# Patient Record
Sex: Female | Born: 1955 | Race: White | Hispanic: No | Marital: Married | Smoking: Never smoker
Health system: Southern US, Community
[De-identification: ages and names within clinical notes are randomized; demographics above are authoritative.]

---

## 2014-05-21 ENCOUNTER — Inpatient Hospital Stay (HOSPITAL_COMMUNITY)
Admission: EM | Admit: 2014-05-21 | Discharge: 2014-06-12 | DRG: 286 | Disposition: E | Payer: No Typology Code available for payment source | Attending: Emergency Medicine | Admitting: Emergency Medicine

## 2014-05-21 ENCOUNTER — Emergency Department (HOSPITAL_COMMUNITY): Payer: No Typology Code available for payment source

## 2014-05-21 ENCOUNTER — Encounter (HOSPITAL_COMMUNITY): Payer: Self-pay | Admitting: Pulmonary Disease

## 2014-05-21 ENCOUNTER — Inpatient Hospital Stay (HOSPITAL_COMMUNITY): Payer: No Typology Code available for payment source

## 2014-05-21 ENCOUNTER — Encounter (HOSPITAL_COMMUNITY): Admission: EM | Disposition: E | Payer: Self-pay | Source: Home / Self Care | Attending: Emergency Medicine

## 2014-05-21 DIAGNOSIS — J9601 Acute respiratory failure with hypoxia: Secondary | ICD-10-CM | POA: Insufficient documentation

## 2014-05-21 DIAGNOSIS — R569 Unspecified convulsions: Secondary | ICD-10-CM | POA: Diagnosis not present

## 2014-05-21 DIAGNOSIS — G931 Anoxic brain damage, not elsewhere classified: Secondary | ICD-10-CM | POA: Diagnosis present

## 2014-05-21 DIAGNOSIS — I469 Cardiac arrest, cause unspecified: Secondary | ICD-10-CM | POA: Diagnosis present

## 2014-05-21 DIAGNOSIS — I5021 Acute systolic (congestive) heart failure: Secondary | ICD-10-CM | POA: Diagnosis present

## 2014-05-21 DIAGNOSIS — R570 Cardiogenic shock: Secondary | ICD-10-CM | POA: Diagnosis present

## 2014-05-21 DIAGNOSIS — N179 Acute kidney failure, unspecified: Secondary | ICD-10-CM | POA: Diagnosis present

## 2014-05-21 DIAGNOSIS — D696 Thrombocytopenia, unspecified: Secondary | ICD-10-CM | POA: Diagnosis not present

## 2014-05-21 DIAGNOSIS — E872 Acidosis: Secondary | ICD-10-CM | POA: Diagnosis not present

## 2014-05-21 DIAGNOSIS — G9340 Encephalopathy, unspecified: Secondary | ICD-10-CM | POA: Insufficient documentation

## 2014-05-21 DIAGNOSIS — Z66 Do not resuscitate: Secondary | ICD-10-CM | POA: Diagnosis not present

## 2014-05-21 DIAGNOSIS — I209 Angina pectoris, unspecified: Secondary | ICD-10-CM

## 2014-05-21 DIAGNOSIS — E871 Hypo-osmolality and hyponatremia: Secondary | ICD-10-CM | POA: Diagnosis not present

## 2014-05-21 DIAGNOSIS — G936 Cerebral edema: Secondary | ICD-10-CM | POA: Diagnosis not present

## 2014-05-21 DIAGNOSIS — R739 Hyperglycemia, unspecified: Secondary | ICD-10-CM | POA: Diagnosis not present

## 2014-05-21 DIAGNOSIS — E876 Hypokalemia: Secondary | ICD-10-CM | POA: Diagnosis present

## 2014-05-21 DIAGNOSIS — K7200 Acute and subacute hepatic failure without coma: Secondary | ICD-10-CM | POA: Diagnosis present

## 2014-05-21 DIAGNOSIS — I2510 Atherosclerotic heart disease of native coronary artery without angina pectoris: Secondary | ICD-10-CM

## 2014-05-21 DIAGNOSIS — E875 Hyperkalemia: Secondary | ICD-10-CM | POA: Diagnosis not present

## 2014-05-21 DIAGNOSIS — I447 Left bundle-branch block, unspecified: Secondary | ICD-10-CM | POA: Diagnosis not present

## 2014-05-21 DIAGNOSIS — G40901 Epilepsy, unspecified, not intractable, with status epilepticus: Secondary | ICD-10-CM | POA: Diagnosis present

## 2014-05-21 DIAGNOSIS — I4901 Ventricular fibrillation: Principal | ICD-10-CM | POA: Diagnosis present

## 2014-05-21 DIAGNOSIS — J690 Pneumonitis due to inhalation of food and vomit: Secondary | ICD-10-CM | POA: Diagnosis not present

## 2014-05-21 DIAGNOSIS — J189 Pneumonia, unspecified organism: Secondary | ICD-10-CM

## 2014-05-21 DIAGNOSIS — R079 Chest pain, unspecified: Secondary | ICD-10-CM

## 2014-05-21 HISTORY — PX: LEFT HEART CATH: SHX5478

## 2014-05-21 LAB — BASIC METABOLIC PANEL
ANION GAP: 15 (ref 5–15)
Anion gap: 11 (ref 5–15)
Anion gap: 11 (ref 5–15)
Anion gap: 15 (ref 5–15)
BUN: 8 mg/dL (ref 6–23)
BUN: 9 mg/dL (ref 6–23)
BUN: 10 mg/dL (ref 6–23)
BUN: 11 mg/dL (ref 6–23)
CALCIUM: 6.8 mg/dL — AB (ref 8.4–10.5)
CALCIUM: 7.8 mg/dL — AB (ref 8.4–10.5)
CHLORIDE: 108 meq/L (ref 96–112)
CO2: 19 mmol/L (ref 19–32)
CO2: 19 mmol/L (ref 19–32)
CO2: 16 mmol/L — ABNORMAL LOW (ref 19–32)
CO2: 15 mmol/L — ABNORMAL LOW (ref 19–32)
CREATININE: 0.88 mg/dL (ref 0.50–1.10)
Calcium: 7.7 mg/dL — ABNORMAL LOW (ref 8.4–10.5)
Calcium: 7.9 mg/dL — ABNORMAL LOW (ref 8.4–10.5)
Chloride: 108 mEq/L (ref 96–112)
Chloride: 108 mEq/L (ref 96–112)
Chloride: 107 mEq/L (ref 96–112)
Creatinine, Ser: 0.79 mg/dL (ref 0.50–1.10)
Creatinine, Ser: 0.77 mg/dL (ref 0.50–1.10)
Creatinine, Ser: 0.74 mg/dL (ref 0.50–1.10)
GFR calc Af Amer: >90 mL/min (ref >90)
GFR calc Af Amer: 83 mL/min — ABNORMAL LOW (ref >90)
GFR calc Af Amer: >90 mL/min (ref >90)
GFR calc Af Amer: >90 mL/min (ref >90)
GFR calc non Af Amer: >90 mL/min (ref >90)
GFR calc non Af Amer: >90 mL/min (ref >90)
GFR, EST NON AFRICAN AMERICAN: 72 mL/min — AB (ref >90)
GLUCOSE: 196 mg/dL — AB (ref 70–99)
Glucose, Bld: 243 mg/dL — ABNORMAL HIGH (ref 70–99)
Glucose, Bld: 135 mg/dL — ABNORMAL HIGH (ref 70–99)
Glucose, Bld: 169 mg/dL — ABNORMAL HIGH (ref 70–99)
POTASSIUM: 3.3 mmol/L — AB (ref 3.5–5.1)
POTASSIUM: 3.2 mmol/L — AB (ref 3.5–5.1)
Potassium: 2.1 mmol/L — CL (ref 3.5–5.1)
Potassium: 3.7 mmol/L (ref 3.5–5.1)
SODIUM: 138 mmol/L (ref 135–145)
SODIUM: 138 mmol/L (ref 135–145)
Sodium: 138 mmol/L (ref 135–145)
Sodium: 138 mmol/L (ref 135–145)

## 2014-05-21 LAB — RAPID URINE DRUG SCREEN, HOSP PERFORMED
Amphetamines: NOT DETECTED
BARBITURATES: NOT DETECTED
Benzodiazepines: POSITIVE — AB
COCAINE: NOT DETECTED
Opiates: NOT DETECTED
TETRAHYDROCANNABINOL: POSITIVE — AB

## 2014-05-21 LAB — GLUCOSE, CAPILLARY
GLUCOSE-CAPILLARY: 114 mg/dL — AB (ref 70–99)
Glucose-Capillary: 120 mg/dL — ABNORMAL HIGH (ref 70–99)
Glucose-Capillary: 123 mg/dL — ABNORMAL HIGH (ref 70–99)
Glucose-Capillary: 130 mg/dL — ABNORMAL HIGH (ref 70–99)
Glucose-Capillary: 154 mg/dL — ABNORMAL HIGH (ref 70–99)
Glucose-Capillary: 155 mg/dL — ABNORMAL HIGH (ref 70–99)
Glucose-Capillary: 159 mg/dL — ABNORMAL HIGH (ref 70–99)
Glucose-Capillary: 161 mg/dL — ABNORMAL HIGH (ref 70–99)
Glucose-Capillary: 179 mg/dL — ABNORMAL HIGH (ref 70–99)
Glucose-Capillary: 185 mg/dL — ABNORMAL HIGH (ref 70–99)
Glucose-Capillary: 199 mg/dL — ABNORMAL HIGH (ref 70–99)
Glucose-Capillary: 219 mg/dL — ABNORMAL HIGH (ref 70–99)
Glucose-Capillary: 222 mg/dL — ABNORMAL HIGH (ref 70–99)

## 2014-05-21 LAB — CBC WITH DIFFERENTIAL/PLATELET
Basophils Absolute: 0.1 10*3/uL (ref 0.0–0.1)
Basophils Relative: 1 % (ref 0–1)
EOS ABS: 0.2 10*3/uL (ref 0.0–0.7)
EOS PCT: 2 % (ref 0–5)
HEMATOCRIT: 36.4 % (ref 36.0–46.0)
Hemoglobin: 12.7 g/dL (ref 12.0–15.0)
LYMPHS ABS: 4.2 10*3/uL — AB (ref 0.7–4.0)
LYMPHS PCT: 44 % (ref 12–46)
MCH: 37.7 pg — AB (ref 26.0–34.0)
MCHC: 34.9 g/dL (ref 30.0–36.0)
MCV: 108 fL — ABNORMAL HIGH (ref 78.0–100.0)
MONO ABS: 0.8 10*3/uL (ref 0.1–1.0)
Monocytes Relative: 9 % (ref 3–12)
Neutro Abs: 4.3 10*3/uL (ref 1.7–7.7)
Neutrophils Relative %: 45 % (ref 43–77)
PLATELETS: 105 10*3/uL — AB (ref 150–400)
RBC: 3.37 MIL/uL — AB (ref 3.87–5.11)
RDW: 12.3 % (ref 11.5–15.5)
WBC: 9.5 10*3/uL (ref 4.0–10.5)

## 2014-05-21 LAB — URINE MICROSCOPIC-ADD ON

## 2014-05-21 LAB — URINALYSIS, ROUTINE W REFLEX MICROSCOPIC
Bilirubin Urine: NEGATIVE
Ketones, ur: NEGATIVE mg/dL
LEUKOCYTES UA: NEGATIVE
NITRITE: NEGATIVE
PH: 7.5 (ref 5.0–8.0)
Protein, ur: >300 mg/dL — AB
SPECIFIC GRAVITY, URINE: 1.013 (ref 1.005–1.030)
Urobilinogen, UA: 1 mg/dL (ref 0.0–1.0)

## 2014-05-21 LAB — PROTIME-INR
INR: 1.23 (ref 0.00–1.49)
INR: 0.98 (ref 0.00–1.49)
Prothrombin Time: 15.6 seconds — ABNORMAL HIGH (ref 11.6–15.2)
Prothrombin Time: 13.1 seconds (ref 11.6–15.2)

## 2014-05-21 LAB — COMPREHENSIVE METABOLIC PANEL
ALT: 71 U/L — AB (ref 0–35)
AST: 159 U/L — ABNORMAL HIGH (ref 0–37)
Albumin: 2.4 g/dL — ABNORMAL LOW (ref 3.5–5.2)
Alkaline Phosphatase: 93 U/L (ref 39–117)
Anion gap: 17 — ABNORMAL HIGH (ref 5–15)
BUN: 8 mg/dL (ref 6–23)
CHLORIDE: 97 meq/L (ref 96–112)
CO2: 21 mmol/L (ref 19–32)
Calcium: 8 mg/dL — ABNORMAL LOW (ref 8.4–10.5)
Creatinine, Ser: 1.28 mg/dL — ABNORMAL HIGH (ref 0.50–1.10)
GFR, EST AFRICAN AMERICAN: 53 mL/min — AB (ref >90)
GFR, EST NON AFRICAN AMERICAN: 45 mL/min — AB (ref >90)
GLUCOSE: 406 mg/dL — AB (ref 70–99)
Potassium: 2.3 mmol/L — CL (ref 3.5–5.1)
SODIUM: 135 mmol/L (ref 135–145)
TOTAL PROTEIN: 5 g/dL — AB (ref 6.0–8.3)
Total Bilirubin: 1.7 mg/dL — ABNORMAL HIGH (ref 0.3–1.2)

## 2014-05-21 LAB — POCT I-STAT, CHEM 8
BUN: 7 mg/dL (ref 6–23)
BUN: 9 mg/dL (ref 6–23)
CALCIUM ION: 1.01 mmol/L — AB (ref 1.12–1.23)
CREATININE: 0.6 mg/dL (ref 0.50–1.10)
Calcium, Ion: 1.01 mmol/L — ABNORMAL LOW (ref 1.12–1.23)
Chloride: 104 mEq/L (ref 96–112)
Chloride: 103 mEq/L (ref 96–112)
Creatinine, Ser: 0.6 mg/dL (ref 0.50–1.10)
Glucose, Bld: 188 mg/dL — ABNORMAL HIGH (ref 70–99)
Glucose, Bld: 235 mg/dL — ABNORMAL HIGH (ref 70–99)
HEMATOCRIT: 36 % (ref 36.0–46.0)
HEMATOCRIT: 41 % (ref 36.0–46.0)
HEMOGLOBIN: 12.2 g/dL (ref 12.0–15.0)
HEMOGLOBIN: 13.9 g/dL (ref 12.0–15.0)
POTASSIUM: 2 mmol/L — AB (ref 3.5–5.1)
POTASSIUM: 2.9 mmol/L — AB (ref 3.5–5.1)
Sodium: 141 mmol/L (ref 135–145)
Sodium: 139 mmol/L (ref 135–145)
TCO2: 18 mmol/L (ref 0–100)
TCO2: 16 mmol/L (ref 0–100)

## 2014-05-21 LAB — I-STAT ARTERIAL BLOOD GAS, ED
Acid-base deficit: 4 mmol/L — ABNORMAL HIGH (ref 0.0–2.0)
Bicarbonate: 23.1 mEq/L (ref 20.0–24.0)
O2 SAT: 100 %
PO2 ART: 505 mmHg — AB (ref 80.0–100.0)
TCO2: 25 mmol/L (ref 0–100)
pCO2 arterial: 49.8 mmHg — ABNORMAL HIGH (ref 35.0–45.0)
pH, Arterial: 7.274 — ABNORMAL LOW (ref 7.350–7.450)

## 2014-05-21 LAB — ETHANOL

## 2014-05-21 LAB — TROPONIN I
TROPONIN I: 0.09 ng/mL — AB (ref <0.031)
Troponin I: 5.82 ng/mL (ref <0.031)
Troponin I: 5.81 ng/mL (ref <0.031)
Troponin I: 5.26 ng/mL (ref <0.031)

## 2014-05-21 LAB — APTT
APTT: 34 s (ref 24–37)
aPTT: 45 seconds — ABNORMAL HIGH (ref 24–37)
aPTT: 28 seconds (ref 24–37)
aPTT: 28 seconds (ref 24–37)

## 2014-05-21 LAB — I-STAT CG4 LACTIC ACID, ED: LACTIC ACID, VENOUS: 9.93 mmol/L — AB (ref 0.5–2.2)

## 2014-05-21 LAB — MRSA PCR SCREENING: MRSA BY PCR: NEGATIVE

## 2014-05-21 SURGERY — LEFT HEART CATH
Anesthesia: LOCAL

## 2014-05-21 MED ORDER — HEPARIN (PORCINE) IN NACL 100-0.45 UNIT/ML-% IJ SOLN
600.0000 [IU]/h | INTRAMUSCULAR | Status: DC
  Administered 2014-05-21: 600 [IU]/h via INTRAVENOUS
  Filled 2014-05-21: qty 250

## 2014-05-21 MED ORDER — SODIUM CHLORIDE 0.9 % IV SOLN: INTRAVENOUS | Status: DC

## 2014-05-21 MED ORDER — CISATRACURIUM BOLUS VIA INFUSION
0.1000 mg/kg | Freq: Once | INTRAVENOUS | Status: DC
  Filled 2014-05-21: qty 7

## 2014-05-21 MED ORDER — NOREPINEPHRINE BITARTRATE 1 MG/ML IV SOLN
0.0000 ug/min | INTRAVENOUS | Status: DC
  Administered 2014-05-21: 5 ug/min via INTRAVENOUS
  Administered 2014-05-22: 17 ug/min via INTRAVENOUS
  Filled 2014-05-21 (×2): qty 4

## 2014-05-21 MED ORDER — MIDAZOLAM HCL 2 MG/2ML IJ SOLN: 2.0000 mg | Freq: Once | INTRAMUSCULAR | Status: DC

## 2014-05-21 MED ORDER — LIDOCAINE HCL (PF) 1 % IJ SOLN
INTRAMUSCULAR | Status: AC
  Filled 2014-05-21: qty 30

## 2014-05-21 MED ORDER — SODIUM CHLORIDE 0.9 % IV SOLN
10.0000 ug/h | INTRAVENOUS | Status: DC
  Administered 2014-05-21 (×2): 10 ug/h via INTRAVENOUS
  Filled 2014-05-21: qty 50

## 2014-05-21 MED ORDER — PROPOFOL 10 MG/ML IV EMUL
5.0000 ug/kg/min | INTRAVENOUS | Status: DC
  Administered 2014-05-21: 10 ug/kg/min via INTRAVENOUS

## 2014-05-21 MED ORDER — POTASSIUM CHLORIDE 20 MEQ/15ML (10%) PO SOLN
20.0000 meq | ORAL | Status: AC
  Administered 2014-05-21 (×3): 20 meq via ORAL
  Filled 2014-05-21 (×3): qty 15

## 2014-05-21 MED ORDER — POTASSIUM CHLORIDE 10 MEQ/50ML IV SOLN
10.0000 meq | INTRAVENOUS | Status: AC
  Administered 2014-05-21 – 2014-05-22 (×2): 10 meq via INTRAVENOUS
  Filled 2014-05-21: qty 50

## 2014-05-21 MED ORDER — FENTANYL BOLUS VIA INFUSION
50.0000 ug | INTRAVENOUS | Status: DC | PRN
  Filled 2014-05-21: qty 50

## 2014-05-21 MED ORDER — ARTIFICIAL TEARS OP OINT: TOPICAL_OINTMENT | OPHTHALMIC | Status: DC | PRN

## 2014-05-21 MED ORDER — ONDANSETRON HCL 4 MG/2ML IJ SOLN: 4.0000 mg | Freq: Four times a day (QID) | INTRAMUSCULAR | Status: DC | PRN

## 2014-05-21 MED ORDER — MIDAZOLAM BOLUS VIA INFUSION
2.0000 mg | INTRAVENOUS | Status: DC | PRN
  Filled 2014-05-21: qty 2

## 2014-05-21 MED ORDER — SODIUM CHLORIDE 0.9 % IV SOLN
INTRAVENOUS | Status: DC
  Administered 2014-05-21: 1.3 [IU]/h via INTRAVENOUS
  Filled 2014-05-21: qty 2.5

## 2014-05-21 MED ORDER — EPINEPHRINE HCL 1 MG/ML IJ SOLN
0.5000 ug/min | INTRAMUSCULAR | Status: DC
  Administered 2014-05-21: 0.5 ug/min via INTRAVENOUS
  Filled 2014-05-21: qty 4

## 2014-05-21 MED ORDER — SODIUM CHLORIDE 0.9 % IV SOLN
1.0000 ug/kg/min | INTRAVENOUS | Status: DC
  Administered 2014-05-22: 1.8 ug/kg/min via INTRAVENOUS
  Filled 2014-05-21 (×2): qty 20

## 2014-05-21 MED ORDER — FENTANYL CITRATE 0.05 MG/ML IJ SOLN: 100.0000 ug | Freq: Once | INTRAMUSCULAR | Status: DC

## 2014-05-21 MED ORDER — CHLORHEXIDINE GLUCONATE 0.12 % MT SOLN
15.0000 mL | Freq: Two times a day (BID) | OROMUCOSAL | Status: DC
  Administered 2014-05-21 – 2014-05-23 (×5): 15 mL via OROMUCOSAL
  Filled 2014-05-21 (×4): qty 15

## 2014-05-21 MED ORDER — ARTIFICIAL TEARS OP OINT
1.0000 "application " | TOPICAL_OINTMENT | Freq: Three times a day (TID) | OPHTHALMIC | Status: DC
  Administered 2014-05-21 – 2014-05-23 (×6): 1 via OPHTHALMIC
  Filled 2014-05-21: qty 3.5

## 2014-05-21 MED ORDER — NITROGLYCERIN 1 MG/10 ML FOR IR/CATH LAB
INTRA_ARTERIAL | Status: AC
  Filled 2014-05-21: qty 10

## 2014-05-21 MED ORDER — SODIUM CHLORIDE 0.9 % IV SOLN
INTRAVENOUS | Status: DC
  Administered 2014-05-21 – 2014-05-25 (×2): via INTRAVENOUS

## 2014-05-21 MED ORDER — SODIUM CHLORIDE 0.9 % IV SOLN: 2000.0000 mL | Freq: Once | INTRAVENOUS | Status: DC

## 2014-05-21 MED ORDER — HEPARIN (PORCINE) IN NACL 2-0.9 UNIT/ML-% IJ SOLN
INTRAMUSCULAR | Status: AC
  Filled 2014-05-21: qty 1500

## 2014-05-21 MED ORDER — POTASSIUM CHLORIDE 10 MEQ/50ML IV SOLN
10.0000 meq | INTRAVENOUS | Status: AC
  Administered 2014-05-21 (×6): 10 meq via INTRAVENOUS
  Filled 2014-05-21 (×6): qty 50

## 2014-05-21 MED ORDER — CETYLPYRIDINIUM CHLORIDE 0.05 % MT LIQD
7.0000 mL | Freq: Four times a day (QID) | OROMUCOSAL | Status: DC
  Administered 2014-05-21 – 2014-05-23 (×7): 7 mL via OROMUCOSAL

## 2014-05-21 MED ORDER — FAMOTIDINE IN NACL 20-0.9 MG/50ML-% IV SOLN
20.0000 mg | Freq: Two times a day (BID) | INTRAVENOUS | Status: DC
  Administered 2014-05-21 (×2): 20 mg via INTRAVENOUS
  Filled 2014-05-21 (×4): qty 50

## 2014-05-21 MED ORDER — ACETAMINOPHEN 325 MG PO TABS: 650.0000 mg | ORAL_TABLET | ORAL | Status: DC | PRN

## 2014-05-21 MED ORDER — ASPIRIN 300 MG RE SUPP
300.0000 mg | RECTAL | Status: AC
  Administered 2014-05-21: 300 mg via RECTAL
  Filled 2014-05-21: qty 1

## 2014-05-21 MED ORDER — PANTOPRAZOLE SODIUM 40 MG IV SOLR
40.0000 mg | INTRAVENOUS | Status: DC
  Administered 2014-05-22: 40 mg via INTRAVENOUS
  Filled 2014-05-21 (×3): qty 40

## 2014-05-21 MED ORDER — INSULIN ASPART 100 UNIT/ML ~~LOC~~ SOLN
2.0000 [IU] | SUBCUTANEOUS | Status: DC
  Administered 2014-05-21: 4 [IU] via SUBCUTANEOUS

## 2014-05-21 MED ORDER — HEPARIN BOLUS VIA INFUSION
2500.0000 [IU] | Freq: Once | INTRAVENOUS | Status: AC
  Administered 2014-05-21: 2500 [IU] via INTRAVENOUS
  Filled 2014-05-21: qty 2500

## 2014-05-21 MED ORDER — SODIUM CHLORIDE 0.9 % IV SOLN
1.0000 ug/kg/min | INTRAVENOUS | Status: DC
  Administered 2014-05-21: 1 ug/kg/min via INTRAVENOUS
  Filled 2014-05-21: qty 20

## 2014-05-21 MED ORDER — SODIUM CHLORIDE 0.9 % IV SOLN
1.0000 mg/h | INTRAVENOUS | Status: DC
  Administered 2014-05-21: 1 mg/h via INTRAVENOUS
  Administered 2014-05-21: 2 mg/h via INTRAVENOUS
  Administered 2014-05-21 – 2014-05-22 (×3): 4 mg/h via INTRAVENOUS
  Administered 2014-05-22: 10 mg/h via INTRAVENOUS
  Filled 2014-05-21 (×3): qty 10

## 2014-05-21 MED ORDER — SODIUM CHLORIDE 0.9 % IV SOLN
INTRAVENOUS | Status: DC
  Administered 2014-05-21: 10:00:00 via INTRAVENOUS

## 2014-05-21 MED ORDER — SODIUM CHLORIDE 0.9 % IV SOLN
25.0000 ug/h | INTRAVENOUS | Status: DC
  Administered 2014-05-21: 200 ug/h via INTRAVENOUS
  Filled 2014-05-21 (×3): qty 50

## 2014-05-21 MED ORDER — PROPOFOL 10 MG/ML IV EMUL
INTRAVENOUS | Status: AC
  Filled 2014-05-21: qty 100

## 2014-05-21 MED ORDER — CISATRACURIUM BOLUS VIA INFUSION
0.0500 mg/kg | INTRAVENOUS | Status: AC | PRN
  Administered 2014-05-21: 3.5 mg via INTRAVENOUS
  Filled 2014-05-21: qty 4

## 2014-05-21 MED ORDER — HEPARIN SODIUM (PORCINE) 5000 UNIT/ML IJ SOLN
5000.0000 [IU] | Freq: Three times a day (TID) | INTRAMUSCULAR | Status: DC
  Administered 2014-05-21 – 2014-05-23 (×5): 5000 [IU] via SUBCUTANEOUS
  Filled 2014-05-21 (×9): qty 1

## 2014-05-21 NOTE — CV Procedure (Signed)
   Cardiac Catheterization Procedure Note  Name: [name] Solis MRN: [id number] DOB: [birth year]  Procedure: Left Heart Cath, Selective Coronary Angiography, LV angiography  Indication: Out-of-hospital VF arrest  Procedural details: The right groin was prepped, draped, and anesthetized with 1% lidocaine. Using modified Seldinger technique, a 6 French sheath was introduced into the right femoral artery. Standard Judkins catheters were used for coronary angiography and left ventriculography. Catheter exchanges were performed over a guidewire. A Perclose device was used for femoral hemostasis. There were no immediate procedural complications. The patient was transferred to the post catheterization recovery area for further monitoring.  Procedural Findings: Hemodynamics:  AO 114/85 LV 111/27   Coronary angiography: Coronary dominance: right  Left mainstem: Patent without stenosis  Left anterior descending (LAD): Reaches the LV apex. No stenosis throughout the LAD. Mild stenosis of the ostium and proximal first diagonal of approximately 30%  Left circumflex (LCx): No stenosis. Supplies 2 OM branches without stenosis.   Right coronary artery (RCA): Large, dominant vessel without obstruction. Angiographically normal. Supplies a large acute marginal and a PDA/PLA branch.  Left ventriculography: There is severe global LV systolic dysfunction with akinesis of the entire anterolateral and inferior walls. LVEF estimated at 20%   Final Conclusions:   1. Minimal nonobstructive CAD 2. Severe LV dysfunction  Recommendations: Supportive care. Suspect cardiac arrest secondary to hypokalemia. Discussed with CCM.  Tonny BollmanMichael Derra Shartzer 03/14/2015, 9:35 AM

## 2014-05-21 NOTE — ED Notes (Signed)
Resident at bedside attempting to establish IV access.

## 2014-05-21 NOTE — Procedures (Signed)
Arterial Catheter Insertion Procedure Note Joya Martyraula Archambeault [id number] 10/19/1955  Procedure: Insertion of Arterial Catheter  Indications: Blood pressure monitoring and Frequent blood sampling  Procedure Details Consent: Risks of procedure as well as the alternatives and risks of each were explained to the (patient/caregiver).  Consent for procedure obtained. Time Out: Verified patient identification, verified procedure, site/side was marked, verified correct patient position, special equipment/implants available, medications/allergies/relevent history reviewed, required imaging and test results available.  Performed  Maximum sterile technique was used including antiseptics, cap, gloves, gown, hand hygiene, mask and sheet. Skin prep: Chlorhexidine; local anesthetic administered 20 gauge catheter was inserted into left femoral artery using the Seldinger technique.  Evaluation Blood flow good; BP tracing good. Complications: No apparent complications.   BABCOCK,PETE Apr 12, 2015  Levy Pupaobert Adien Kimmel, MD, PhD Apr 12, 2015, 5:45 PM [facility] [phone] or if no answer [phone]

## 2014-05-21 NOTE — H&P (Signed)
PULMONARY / CRITICAL CARE MEDICINE HISTORY AND PHYSICAL EXAMINATION   Name: [name] Solis MRN: [id number] DOB: [birth year]    ADMISSION DATE:  13-Jan-2015  PRIMARY SERVICE: PCCM  CHIEF COMPLAINT:  Vfib arrest  BRIEF PATIENT DESCRIPTION: 59yo, no PMH, collapsed at home, CPR immediately start with ROSC after about 10 minutes, signs of ischemia on EKG.  Likely cath imminently  SIGNIFICANT EVENTS / STUDIES:  May 06, 2015:  Admitted, Vfib arrest, EKG with ST depressions, Hypothermia protocol started  LINES / TUBES: Foley: May 06, 2015-->  CULTURES: None  ANTIBIOTICS: None  HISTORY OF PRESENT ILLNESS:  59yof with no PMH collapsed at home, witnessed by boyfriend.  CPR started with ROSC in <10 min.  Vfib noted by EMS s/p multiple shocks.  In ED, pt started on epi gtt and hypothermia protocol initiated.  ECG with ST depressions in a pattern of ischemia. Cardiology aware and planning to evaluate.    PAST MEDICAL HISTORY :  No past medical history on file. No past surgical history on file. Prior to Admission medications   Not on File   No Known Allergies  FAMILY HISTORY:  No family history on file. SOCIAL HISTORY:  reports that she has never smoked. She does not have any smokeless tobacco history on file. Her alcohol and drug histories are not on file.  REVIEW OF SYSTEMS:  Unable to obtain  SUBJECTIVE: Unable to obtain  VITAL SIGNS: Temp:  [94.5 F (34.7 C)-95.5 F (35.3 C)] 95.5 F (35.3 C) (01/10 0630) Pulse Rate:  [84-94] 84 (01/10 0741) Resp:  [14-20] 20 (01/10 0741) BP: (120-130)/(72-77) 120/74 mmHg (01/10 0741) SpO2:  [100 %] 100 % (01/10 0741) FiO2 (%):  [40 %-60 %] 40 % (01/10 0741) Weight:  [69.996 kg (154 lb 5 oz)-70.2 kg (154 lb 12.2 oz)] 70.2 kg (154 lb 12.2 oz) (01/10 0656) HEMODYNAMICS:   VENTILATOR SETTINGS: Vent Mode:  [-] PRVC FiO2 (%):  [40 %-60 %] 40 % Set Rate:  [14 bmp] 14 bmp Vt Set:  [480 mL-500 mL] 480 mL PEEP:  [5 cmH20] 5 cmH20 Plateau Pressure:  [28  cmH20] 28 cmH20 INTAKE / OUTPUT: Intake/Output      01/09 0701 - 01/10 0700 01/10 0701 - 01/11 0700   I.V. (mL/kg)  2500 (35.6)   Total Intake(mL/kg)  2500 (35.6)   Net   +2500          PHYSICAL EXAMINATION: General:  Sedated, intubated Neuro:  Downgoing toes, sedated HEENT:  PERRL Cardiovascular:  RRR, no murmurs Lungs:  Good air movement bilaterally, no wheezes or crackles Abdomen:  +BS Skin:  No rashes or edema MSK:  Increased tone in UE  LABS:  CBC  Recent Labs Lab 0Dec 25, 2016 0604  WBC 9.5  HGB 12.7  HCT 36.4  PLT PENDING   Coag's  Recent Labs Lab 0Dec 25, 2016 0604  APTT 34  INR 1.23   BMET  Recent Labs Lab 0Dec 25, 2016 0604  NA 135  K 2.3*  CL 97  CO2 21  BUN 8  CREATININE 1.28*  GLUCOSE 406*   Electrolytes  Recent Labs Lab 0Dec 25, 2016 0604  CALCIUM 8.0*   Sepsis Markers  Recent Labs Lab 0Dec 25, 2016 0611  LATICACIDVEN 9.93*   ABG  Recent Labs Lab 0Dec 25, 2016 0627  PHART 7.274*  PCO2ART 49.8*  PO2ART 505.0*   Liver Enzymes  Recent Labs Lab 0Dec 25, 2016 0604  AST 159*  ALT 71*  ALKPHOS 93  BILITOT 1.7*  ALBUMIN 2.4*   Cardiac Enzymes  Recent Labs Lab 0Dec 25, 2016 0604  TROPONINI 0.09*  Glucose No results for input(s): GLUCAP in the last 168 hours.  Imaging Dg Chest Port 1 View  06-09-2014   CLINICAL DATA:  Code STEMI.  Intubated.  NG tube placed.  EXAM: PORTABLE CHEST - 1 VIEW  COMPARISON:  None.  FINDINGS: Endotracheal tube tip measures 4.5 cm above the carinal. Enteric tube tip is off the field of view but below the left hemidiaphragm. Normal heart size and pulmonary vascularity. Mediastinal contours appear intact. Lungs appear clear and expanded.  IMPRESSION: Appliances appear in satisfactory location. No evidence of active pulmonary disease.   Electronically Signed   By: Burman Nieves M.D.   On: 2014/06/09 06:52    EKG: Sinus, rate 80, ST depression in Lead 2,3, AVF as well as V3, V4   ASSESSMENT / PLAN:  Active Problems:    Cardiac arrest   PULMONARY A: Cardiopulmonary arrest  Hypercarbic resp failure P:   MV initiated, PRVC  ABG after 1 hour VAP prevention bundle  CARDIOVASCULAR A: VF arrest Presumed CAD with active ischemia Cardiogenic shock P:   S/p resuscitation, on epi gtt; will wean if able, alternatively transition to norepi Cardiology to evaluate as soon as they are available from the cath lab. Suspect pt will need to go for L heart angio and possible intervention ASA given Heparin will be started depending on cards plans for cath lab Follow troponin (initial 0.9)  RENAL A: Acute renal failure, probable ATN Metabolic / Lactic acidosis (mixed acidosis) Hypokalemia  P:   Supportive care with volume and pressors Careful with nephrotoxins including contrast dye for cath Follow lactate for clearance  Follow BMP  Replace K  GASTROINTESTINAL A: Transaminitis, shock liver P:   Follow LFT Check and follow coags protonix for SUP  HEMATOLOGIC A: Platelet count not yet available P:   Follow CBC  INFECTIOUS A: No evidence active infxn P:   At some risk for aspiration, follow clinically, fever curve, CXR  ENDOCRINE A: No current issues P:   Follow glu on BMP, start POC CBG if indicated  NEUROLOGIC A: Encephalopathy post arrest P:   Meets criteria for hypothermia, target 33C, initiated in ED Sedation and paralytics per protocol Head CT scan now Evaluate MS on rewarming   FAMILY:  Boyfriend updated on pt's status by Dr Eliberto Ivory.   TODAY'S SUMMARY: 59 yo woman, no PMH, suffered witness out of hospital arrest with [facility] in 10 minutes. Evidence for ischemia noted. Hypothermia initiated. Cardiology eval pending  I have personally obtained a history, examined the patient, evaluated laboratory and imaging results, formulated the assessment and plan and placed orders.  CRITICAL CARE: The patient is critically ill with multiple organ systems failure and requires high complexity  decision making for assessment and support, frequent evaluation and titration of therapies, application of advanced monitoring technologies and extensive interpretation of multiple databases. Critical Care Time devoted to patient care services described in this note is 65 minutes.    Levy Pupa, MD, PhD 09-Jun-2014, 7:46 AM [facility] [phone] or if no answer [phone]

## 2014-05-21 NOTE — ED Notes (Signed)
Jewelry given to boyfriend Haynes DageJohn Steenson.

## 2014-05-21 NOTE — Procedures (Signed)
Central Venous Catheter Insertion Procedure Note [name] Solis [id number] [birth year]  Procedure: Insertion of Central Venous Catheter Indications: Assessment of intravascular volume, Drug and/or fluid administration and Frequent blood sampling  Procedure Details Consent: Unable to obtain consent because of emergent medical necessity. Time Out: Verified patient identification, verified procedure, site/side was marked, verified correct patient position, special equipment/implants available, medications/allergies/relevent history reviewed, required imaging and test results available.  Performed  Maximum sterile technique was used including antiseptics, cap, gloves, gown, hand hygiene, mask and sheet. Skin prep: Chlorhexidine; local anesthetic administered A antimicrobial bonded/coated triple lumen catheter was placed in the left subclavian vein using the Seldinger technique.  Evaluation Blood flow good Complications: No apparent complications Patient did tolerate procedure well. Chest X-ray ordered to verify placement.  CXR: normal.  Anaise Sterbenz 05/19/2014, 7:53 AM

## 2014-05-21 NOTE — ED Provider Notes (Signed)
CSN: [id number]     Arrival date & time 06/03/2014  0557 History   First MD Initiated Contact with Patient 05/16/2014 0602     Chief Complaint  Patient presents with  . Post CPR      (Consider location/radiation/quality/duration/timing/severity/associated sxs/prior Treatment) HPI Comments: Patient here after having a witnessed cardiac arrest with bystander CPR for 10 minutes. EMS arrived and patient was in ventricular fibrillation for which she was cardioverted as well as given amiodarone 450 mg. She was also given Narcan as well as an amp of D50. She had return of circulation and during transport pulses were lost in patient was found to be back in V. fib. She was cardioverted again and transported. Patient was hypotensive and started on epinephrine drip. She is also agitated and given Versed 2.5 mg. According to EMS, she was shocked approximately 8 times total. No further history obtainable due to her current condition  The history is provided by the EMS personnel. The history is limited by the condition of the patient.    No past medical history on file. No past surgical history on file. No family history on file. History  Substance Use Topics  . Smoking status: Not on file  . Smokeless tobacco: Not on file  . Alcohol Use: Not on file   OB History    No data available     Review of Systems  Unable to perform ROS     Allergies  Review of patient's allergies indicates not on file.  Home Medications   Prior to Admission medications   Not on File   There were no vitals taken for this visit. Physical Exam  Constitutional: She appears cachectic. She is intubated.  HENT:  Head: Normocephalic and atraumatic.  Eyes: Conjunctivae, EOM and lids are normal. Pupils are equal, round, and reactive to light.  Neck: Normal range of motion. Neck supple. No tracheal deviation present. No thyroid mass present.  Cardiovascular: Normal rate, regular rhythm and normal heart sounds.  Exam  reveals no gallop.   No murmur heard. Pulmonary/Chest: Effort normal and breath sounds normal. No stridor. She is intubated. No respiratory distress. She has no decreased breath sounds. She has no wheezes. She has no rhonchi. She has no rales.  Abdominal: Soft. Normal appearance and bowel sounds are normal. She exhibits no distension. There is no tenderness. There is no rebound and no CVA tenderness.  Musculoskeletal: Normal range of motion. She exhibits no edema or tenderness.  Neurological: She is unresponsive. GCS eye subscore is 1. GCS verbal subscore is 1. GCS motor subscore is 1.  Skin: Skin is warm and dry. No abrasion and no rash noted.  Nursing note and vitals reviewed.   ED Course  Procedures (including critical care time) Labs Review Labs Reviewed  I-STAT CG4 LACTIC ACID, ED - Abnormal; Notable for the following:    Lactic Acid, Venous 9.93 (*)    All other components within normal limits  CBC WITH DIFFERENTIAL  COMPREHENSIVE METABOLIC PANEL  TROPONIN I  APTT  PROTIME-INR  BLOOD GAS, ARTERIAL  ETHANOL  URINE RAPID DRUG SCREEN (HOSP PERFORMED)  URINALYSIS, ROUTINE W REFLEX MICROSCOPIC    Imaging Review No results found.   EKG Interpretation None      MDM   Final diagnoses:  Chest pain     Date: 05/14/2014  Rate: 70  Rhythm: normal sinus rhythm  QRS Axis: normal  Intervals: QT prolonged  ST/T Wave abnormalities: ST depressions laterally  Conduction Disutrbances:none  Narrative  Interpretation:   Old EKG Reviewed: none available  Hyperthermia protocol activated. Discussed with intensivist, Dr. Detterding, patient to be cooled to [facility]36C. Will consult cardiology about ischemic EKG.  Patient rechecked multiple times in blood pressure has been stable on Epi drip. She will be admitted to the ICU  CRITICAL CARE Performed by: Toy BakerALLEN,Marnisha Stampley T Total critical care time: 60 Critical care time was exclusive of separately billable procedures and treating other  patients. Critical care was necessary to treat or prevent imminent or life-threatening deterioration. Critical care was time spent personally by me on the following activities: development of treatment plan with patient and/or surrogate as well as nursing, discussions with consultants, evaluation of patient's response to treatment, examination of patient, obtaining history from patient or surrogate, ordering and performing treatments and interventions, ordering and review of laboratory studies, ordering and review of radiographic studies, pulse oximetry and re-evaluation of patient's condition.    Toy BakerAnthony T Candies Palm, MD 05/23/2014 [phone]

## 2014-05-21 NOTE — Progress Notes (Signed)
ANTICOAGULATION CONSULT NOTE - Initial Consult  Pharmacy Consult for Heparin  Indication: chest pain/ACS, s/p CPR, starting arctic sun  No Known Allergies  Patient Measurements: Height: 5\' 6"  (167.6 cm) Weight: 154 lb 12.2 oz (70.2 kg) IBW/kg (Calculated) : 59.3  Vital Signs: Temp: 95.5 F (35.3 C) (01/10 0630) Temp Source: Other (Comment) (01/10 0630) BP: 130/77 mmHg (01/10 0630) Pulse Rate: 94 (01/10 0630)  Labs:  Recent Labs  05/20/2014 0604  APTT 34  LABPROT 15.6*  INR 1.23   Assessment: [age] y/o F s/p CPR with ROSC, starting arctic sun protocol (33 degrees), starting heparin per pharmacy. Baseline INR 1.23, other labs pending.  Goal of Therapy:  Heparin level 0.3-0.7 units/ml Monitor platelets by anticoagulation protocol: Yes   Plan:  -Heparin 2500 units BOLUS -Start heparin drip at 600 units/hr -1600 HL -Daily CBC/HL -Monitor for bleeding  Abran DukeLedford, Gabby Rackers 05/25/2014,7:15 AM

## 2014-05-21 NOTE — Progress Notes (Signed)
RT Note: RT attempted to place radial aline x2, unable to thread catheter. RT to monitor.

## 2014-05-21 NOTE — Consult Note (Addendum)
CARDIOLOGY CONSULT NOTE  Patient ID: [name] Solis, MRN: [id number], DOB/AGE: 1956-02-09 59 y.o. Admit date: 05/14/2014 Date of Consult: 06/01/2014  Primary Physician: No primary care provider on file. Primary Cardiologist: none Referring Physician: Dr Delton Coombes  Chief Complaint: Out-of-hospital cardiac arrest  Reason for Consultation: Out-of-hospital VF arrest  HPI: 59 year-old woman with no past medical history who collapsed at home with a witnessed cardiac arrest. CPR was immediately started and on EMS arrival she was in VF as initial rhythm. Reportedly received multiple shocks with return of sinus rhythm and spontaneous circulation. Seen by CCM in the Emergency Dept and noted to have diffuse ST depression on EKG. Referred for cardiac cath per protocol. No other history obtainable at this time.   ADDENDUM: Interview with husband: Pt suddenly dropped to the floor this am after getting out of bed. He immediately started CPR and called EMS. She was in her normal state of health last pm without complaints. No CP, shortness of breath, or lightheadedness. She did have a GI illness earlier this week with diarrhea, nausea, and vomiting, and poor PO intake.  Medical History: History reviewed. No pertinent past medical history.    Surgical History: No past surgical history on file.   Home Meds: Prior to Admission medications   Not on File    Inpatient Medications:  . [MAR Hold] sodium chloride  2,000 mL Intravenous Once  . [MAR Hold] artificial tears  1 application Both Eyes 3 times per day  . [MAR Hold] cisatracurium  0.1 mg/kg Intravenous Once  . [MAR Hold] famotidine (PEPCID) IV  20 mg Intravenous Q12H  . [MAR Hold] fentaNYL  100 mcg Intravenous Once  . [MAR Hold] midazolam  2 mg Intravenous Once  . [MAR Hold] pantoprazole (PROTONIX) IV  40 mg Intravenous Q24H   . [MAR Hold] cisatracurium (NIMBEX) infusion 1 mcg/kg/min (05/27/2014 0758)  . [MAR Hold] epinephrine 0.5 mcg/min (06/06/2014  0645)  . fentaNYL infusion INTRAVENOUS 10 mcg/hr (06/11/2014 0715)  . fentaNYL infusion INTRAVENOUS    . heparin 600 Units/hr (05/14/2014 0757)  . midazolam (VERSED) infusion    . [MAR Hold] norepinephrine (LEVOPHED) Adult infusion      Allergies: No Known Allergies  History   Social History  . Marital Status: Married    Spouse Name: N/A    Number of Children: N/A  . Years of Education: N/A   Occupational History  . Not on file.   Social History Main Topics  . Smoking status: Never Smoker   . Smokeless tobacco: Not on file  . Alcohol Use: Not on file  . Drug Use: Not on file  . Sexual Activity: Not on file   Other Topics Concern  . Not on file   Social History Narrative  . No narrative on file     Family Hx: Unobtainable  Review of Systems: Unobtainable  Physical Exam: Blood pressure 120/74, pulse 84, temperature 95.5 F (35.3 C), temperature source Other (Comment), resp. rate 20, height  (1.676 m), weight 154 lb 12.2 oz (70.2 kg), SpO2 100 %. Pt is intubated, sedated HEENT: normal Neck: JVP normal. Carotid upstrokes normal without bruits. No thyromegaly. Lungs: equal expansion, clear bilaterally CV: Apex is discrete and nondisplaced, RRR without murmur or gallop Abd: soft, +BS Back: no spinal deformity noted Ext: no C/C/E        DP/PT pulses intact and = Skin: warm and dry without rash Neuro: unresponsive    Labs:  Recent Labs  05/12/2014 0604  TROPONINI 0.09*   Lab Results  Component Value Date   WBC 9.5 05/24/2014   HGB 12.7 06/08/2014   HCT 36.4 05/17/2014   MCV 108.0* 05/14/2014   PLT 105* 06/11/2014    Recent Labs Lab 05/29/2014 0604  NA 135  K 2.3*  CL 97  CO2 21  BUN 8  CREATININE 1.28*  CALCIUM 8.0*  PROT 5.0*  BILITOT 1.7*  ALKPHOS 93  ALT 71*  AST 159*  GLUCOSE 406*   No results found for: CHOL, HDL, LDLCALC, TRIG No results found for: DDIMER  Radiology/Studies:  Ct Head Wo Contrast  05/31/2014   CLINICAL DATA:   Acute cardiac arrest earlier in the day  EXAM: CT HEAD WITHOUT CONTRAST  TECHNIQUE: Contiguous axial images were obtained from the base of the skull through the vertex without intravenous contrast.  COMPARISON:  None.  FINDINGS: The ventricles are normal in size and configuration. There is, however, mild frontal atrophy bilaterally. There is no intracranial mass, hemorrhage, extra-axial fluid collection, or midline shift. There remains gray -white differentiation without appreciable edema by CT. No focal gray-white compartment lesions are identified. No acute infarct is apparent. The bony calvarium appears intact. The mastoid air cells are clear. There is diffuse opacification of the visualize left maxillary antrum. There is rightward deviation of the nasal septum. There is opacification of several ethmoid air cells on the left. Patient is intubated.  IMPRESSION: Mild frontal atrophy bilaterally. No intracranial edema is appreciable. There remains gray-white compartment differentiation. No intracranial mass, hemorrhage, or extra-axial fluid. No acute infarct apparent. Paranasal sinus disease. Rightward deviation nasal septum.   Electronically Signed   By: Bretta BangWilliam  Woodruff M.D.   On: 05/24/2014 08:41   Dg Chest Portable 1 View  06/11/2014   CLINICAL DATA:  Hypoxia.  Status post cardiac arrest  EXAM: PORTABLE CHEST - 1 VIEW  COMPARISON:  Study obtained earlier in the day  FINDINGS: Endotracheal tube tip is 4.1 cm above the carina. Nasogastric tube tip and side port are below the diaphragm. Central catheter tip is in the superior vena cava. No pneumothorax. Lungs are clear. Heart size and pulmonary vascularity are normal. No adenopathy.  IMPRESSION: Tube and catheter positions as described without pneumothorax. No edema or consolidation.   Electronically Signed   By: Bretta BangWilliam  Woodruff M.D.   On: 06/11/2014 08:09   Dg Chest Port 1 View  05/14/2014   CLINICAL DATA:  Code STEMI.  Intubated.  NG tube placed.  EXAM:  PORTABLE CHEST - 1 VIEW  COMPARISON:  None.  FINDINGS: Endotracheal tube tip measures 4.5 cm above the carinal. Enteric tube tip is off the field of view but below the left hemidiaphragm. Normal heart size and pulmonary vascularity. Mediastinal contours appear intact. Lungs appear clear and expanded.  IMPRESSION: Appliances appear in satisfactory location. No evidence of active pulmonary disease.   Electronically Signed   By: Burman NievesWilliam  Stevens M.D.   On: 05/31/2014 06:52    EKG: NSR with diffuse ST depression and prolonged QT  ASSESSMENT AND PLAN:  1. VF arrest, witnessed, out-of-hospital 2. Diffuse EKG changes 3. Hypokalemia  Emergent cardiac cath. Primary differential dx includes ischemic event versus hypokalemia/QT prolongation. Primary cardiomyopathy also possible but less likely in this patient who was previously asymptomatic. Further plans pending cardiac cath results. Will replete K with IV and oral supplementation.  Signed, Tonny BollmanMichael Demarus Latterell MD, [facility] 06/10/2014, 8:44 AM

## 2014-05-21 NOTE — ED Notes (Signed)
EMS reports boyfriend stated he heard her fall and went to find her laying on the floor.  EMS called and boyfriend started CPR.  EMS stated patient was down for less then 10 mins.  Totally shocked 8 times, 6 Epi, Amio 450mg , Narcan 2mg , D50 25mg , Epi drip, Versed 2.5mg , cold NS 2000ml Intubated with 6 ETT

## 2014-05-22 ENCOUNTER — Inpatient Hospital Stay (HOSPITAL_COMMUNITY): Payer: No Typology Code available for payment source

## 2014-05-22 DIAGNOSIS — G9340 Encephalopathy, unspecified: Secondary | ICD-10-CM | POA: Insufficient documentation

## 2014-05-22 DIAGNOSIS — G931 Anoxic brain damage, not elsewhere classified: Secondary | ICD-10-CM | POA: Diagnosis present

## 2014-05-22 DIAGNOSIS — G40901 Epilepsy, unspecified, not intractable, with status epilepticus: Secondary | ICD-10-CM | POA: Diagnosis present

## 2014-05-22 DIAGNOSIS — G40301 Generalized idiopathic epilepsy and epileptic syndromes, not intractable, with status epilepticus: Secondary | ICD-10-CM

## 2014-05-22 DIAGNOSIS — J9601 Acute respiratory failure with hypoxia: Secondary | ICD-10-CM | POA: Insufficient documentation

## 2014-05-22 LAB — BASIC METABOLIC PANEL
ANION GAP: 9 (ref 5–15)
Anion gap: 6 (ref 5–15)
Anion gap: 7 (ref 5–15)
Anion gap: 3 — ABNORMAL LOW (ref 5–15)
BUN: 11 mg/dL (ref 6–23)
BUN: 10 mg/dL (ref 6–23)
BUN: 11 mg/dL (ref 6–23)
BUN: 12 mg/dL (ref 6–23)
CALCIUM: 7.2 mg/dL — AB (ref 8.4–10.5)
CHLORIDE: 111 meq/L (ref 96–112)
CHLORIDE: 113 meq/L — AB (ref 96–112)
CO2: 15 mmol/L — ABNORMAL LOW (ref 19–32)
CO2: 21 mmol/L (ref 19–32)
CO2: 19 mmol/L (ref 19–32)
CO2: 16 mmol/L — ABNORMAL LOW (ref 19–32)
CREATININE: 0.85 mg/dL (ref 0.50–1.10)
CREATININE: 0.82 mg/dL (ref 0.50–1.10)
Calcium: 7.5 mg/dL — ABNORMAL LOW (ref 8.4–10.5)
Calcium: 7 mg/dL — ABNORMAL LOW (ref 8.4–10.5)
Calcium: 7.4 mg/dL — ABNORMAL LOW (ref 8.4–10.5)
Chloride: 112 mEq/L (ref 96–112)
Chloride: 110 mEq/L (ref 96–112)
Creatinine, Ser: 0.77 mg/dL (ref 0.50–1.10)
Creatinine, Ser: 0.76 mg/dL (ref 0.50–1.10)
GFR calc Af Amer: >90 mL/min (ref >90)
GFR calc Af Amer: >90 mL/min (ref >90)
GFR calc non Af Amer: >90 mL/min (ref >90)
GFR calc non Af Amer: >90 mL/min (ref >90)
GFR calc non Af Amer: 75 mL/min — ABNORMAL LOW (ref >90)
GFR calc non Af Amer: 78 mL/min — ABNORMAL LOW (ref >90)
GFR, EST AFRICAN AMERICAN: 86 mL/min — AB (ref >90)
GLUCOSE: 169 mg/dL — AB (ref 70–99)
GLUCOSE: 155 mg/dL — AB (ref 70–99)
GLUCOSE: 95 mg/dL (ref 70–99)
Glucose, Bld: 132 mg/dL — ABNORMAL HIGH (ref 70–99)
POTASSIUM: 6.3 mmol/L — AB (ref 3.5–5.1)
Potassium: 4.1 mmol/L (ref 3.5–5.1)
Potassium: 5.4 mmol/L — ABNORMAL HIGH (ref 3.5–5.1)
Potassium: 5.1 mmol/L (ref 3.5–5.1)
SODIUM: 134 mmol/L — AB (ref 135–145)
SODIUM: 134 mmol/L — AB (ref 135–145)
SODIUM: 138 mmol/L (ref 135–145)
Sodium: 136 mmol/L (ref 135–145)

## 2014-05-22 LAB — GLUCOSE, CAPILLARY
GLUCOSE-CAPILLARY: 130 mg/dL — AB (ref 70–99)
GLUCOSE-CAPILLARY: 90 mg/dL (ref 70–99)
Glucose-Capillary: 129 mg/dL — ABNORMAL HIGH (ref 70–99)
Glucose-Capillary: 130 mg/dL — ABNORMAL HIGH (ref 70–99)
Glucose-Capillary: 149 mg/dL — ABNORMAL HIGH (ref 70–99)
Glucose-Capillary: 153 mg/dL — ABNORMAL HIGH (ref 70–99)
Glucose-Capillary: 86 mg/dL (ref 70–99)

## 2014-05-22 LAB — BLOOD GAS, ARTERIAL
ACID-BASE DEFICIT: 10.2 mmol/L — AB (ref 0.0–2.0)
BICARBONATE: 14.7 meq/L — AB (ref 20.0–24.0)
DRAWN BY: 418751
FIO2: 0.4 %
O2 Saturation: 98.3 %
PATIENT TEMPERATURE: 98.6
PCO2 ART: 29.1 mmHg — AB (ref 35.0–45.0)
PEEP: 5 cmH2O
RATE: 14 resp/min
TCO2: 15.5 mmol/L (ref 0–100)
VT: 480 mL
pH, Arterial: 7.322 — ABNORMAL LOW (ref 7.350–7.450)
pO2, Arterial: 133 mmHg — ABNORMAL HIGH (ref 80.0–100.0)

## 2014-05-22 LAB — POCT I-STAT, CHEM 8
BUN: 12 mg/dL (ref 6–23)
BUN: 11 mg/dL (ref 6–23)
BUN: 12 mg/dL (ref 6–23)
CALCIUM ION: 1.09 mmol/L — AB (ref 1.12–1.23)
CALCIUM ION: 1.14 mmol/L (ref 1.12–1.23)
CHLORIDE: 108 meq/L (ref 96–112)
CHLORIDE: 109 meq/L (ref 96–112)
CREATININE: 0.6 mg/dL (ref 0.50–1.10)
Calcium, Ion: 1.07 mmol/L — ABNORMAL LOW (ref 1.12–1.23)
Chloride: 110 mEq/L (ref 96–112)
Creatinine, Ser: 0.6 mg/dL (ref 0.50–1.10)
Creatinine, Ser: 0.6 mg/dL (ref 0.50–1.10)
GLUCOSE: 95 mg/dL (ref 70–99)
Glucose, Bld: 152 mg/dL — ABNORMAL HIGH (ref 70–99)
Glucose, Bld: 167 mg/dL — ABNORMAL HIGH (ref 70–99)
HEMATOCRIT: 43 % (ref 36.0–46.0)
HEMATOCRIT: 40 % (ref 36.0–46.0)
HEMATOCRIT: 40 % (ref 36.0–46.0)
Hemoglobin: 14.6 g/dL (ref 12.0–15.0)
Hemoglobin: 13.6 g/dL (ref 12.0–15.0)
Hemoglobin: 13.6 g/dL (ref 12.0–15.0)
POTASSIUM: 6 mmol/L — AB (ref 3.5–5.1)
Potassium: 6.1 mmol/L (ref 3.5–5.1)
Potassium: 4.5 mmol/L (ref 3.5–5.1)
SODIUM: 141 mmol/L (ref 135–145)
Sodium: 137 mmol/L (ref 135–145)
Sodium: 139 mmol/L (ref 135–145)
TCO2: 14 mmol/L (ref 0–100)
TCO2: 14 mmol/L (ref 0–100)
TCO2: 15 mmol/L (ref 0–100)

## 2014-05-22 LAB — CBC
HCT: 35.2 % — ABNORMAL LOW (ref 36.0–46.0)
Hemoglobin: 12.1 g/dL (ref 12.0–15.0)
MCH: 35.7 pg — ABNORMAL HIGH (ref 26.0–34.0)
MCHC: 34.4 g/dL (ref 30.0–36.0)
MCV: 103.8 fL — AB (ref 78.0–100.0)
PLATELETS: 100 10*3/uL — AB (ref 150–400)
RBC: 3.39 MIL/uL — ABNORMAL LOW (ref 3.87–5.11)
RDW: 12.3 % (ref 11.5–15.5)
WBC: 12.1 10*3/uL — AB (ref 4.0–10.5)

## 2014-05-22 LAB — TROPONIN I: Troponin I: 4.67 ng/mL (ref <0.031)

## 2014-05-22 MED ORDER — ACETAMINOPHEN 160 MG/5ML PO SOLN: 650.0000 mg | Freq: Four times a day (QID) | ORAL | Status: DC | PRN

## 2014-05-22 MED ORDER — LEVETIRACETAM IN NACL 1000 MG/100ML IV SOLN
1000.0000 mg | Freq: Two times a day (BID) | INTRAVENOUS | Status: AC
  Administered 2014-05-22: 1000 mg via INTRAVENOUS
  Filled 2014-05-22: qty 100

## 2014-05-22 MED ORDER — LACOSAMIDE 200 MG/20ML IV SOLN
100.0000 mg | Freq: Two times a day (BID) | INTRAVENOUS | Status: DC
  Administered 2014-05-22 – 2014-05-23 (×2): 100 mg via INTRAVENOUS
  Filled 2014-05-22 (×4): qty 10

## 2014-05-22 MED ORDER — VALPROATE SODIUM 500 MG/5ML IV SOLN
1000.0000 mg | Freq: Once | INTRAVENOUS | Status: AC
  Administered 2014-05-22: 1000 mg via INTRAVENOUS
  Filled 2014-05-22: qty 10

## 2014-05-22 MED ORDER — POTASSIUM CHLORIDE 10 MEQ/50ML IV SOLN
10.0000 meq | INTRAVENOUS | Status: AC
  Administered 2014-05-22 (×4): 10 meq via INTRAVENOUS
  Filled 2014-05-22 (×4): qty 50

## 2014-05-22 MED ORDER — SODIUM CHLORIDE 0.9 % IV SOLN
4.0000 mg/kg/h | INTRAVENOUS | Status: DC
  Administered 2014-05-22: 3 mg/kg/h via INTRAVENOUS
  Filled 2014-05-22: qty 5

## 2014-05-22 MED ORDER — KETAMINE HCL 10 MG/ML IJ SOLN
2.0000 mg/kg | Freq: Once | INTRAMUSCULAR | Status: AC
  Administered 2014-05-22: 110 mg via INTRAVENOUS

## 2014-05-22 MED ORDER — NOREPINEPHRINE BITARTRATE 1 MG/ML IV SOLN
0.0000 ug/min | INTRAVENOUS | Status: DC
  Administered 2014-05-22: 35 ug/min via INTRAVENOUS
  Administered 2014-05-22: 20 ug/min via INTRAVENOUS
  Administered 2014-05-23: 38 ug/min via INTRAVENOUS
  Filled 2014-05-22 (×4): qty 16

## 2014-05-22 MED ORDER — MIDAZOLAM BOLUS VIA INFUSION
2.0000 mg | Freq: Once | INTRAVENOUS | Status: AC
  Administered 2014-05-22: 2 mg via INTRAVENOUS

## 2014-05-22 MED ORDER — SODIUM CHLORIDE 0.9 % IV SOLN
1.0000 mg/kg/h | INTRAVENOUS | Status: DC
  Administered 2014-05-23: 4 mg/kg/h via INTRAVENOUS
  Filled 2014-05-22 (×2): qty 10

## 2014-05-22 MED ORDER — MIDAZOLAM HCL 5 MG/ML IJ SOLN
30.0000 mg/h | INTRAMUSCULAR | Status: DC
  Administered 2014-05-23 – 2014-05-24 (×4): 30 mg/h via INTRAVENOUS
  Administered 2014-05-25: 6 mg/h via INTRAVENOUS
  Filled 2014-05-22 (×6): qty 40

## 2014-05-22 MED ORDER — POTASSIUM CHLORIDE 20 MEQ/15ML (10%) PO SOLN
40.0000 meq | Freq: Once | ORAL | Status: AC
  Administered 2014-05-22: 40 meq
  Filled 2014-05-22: qty 30

## 2014-05-22 MED ORDER — SODIUM CHLORIDE 0.9 % IV SOLN
200.0000 mg | Freq: Once | INTRAVENOUS | Status: AC
  Administered 2014-05-22: 200 mg via INTRAVENOUS
  Filled 2014-05-22 (×2): qty 20

## 2014-05-22 MED ORDER — KETAMINE HCL 10 MG/ML IJ SOLN
1.0000 mg/kg | Freq: Once | INTRAMUSCULAR | Status: AC
  Administered 2014-05-22: 55 mg via INTRAVENOUS

## 2014-05-22 MED ORDER — PHENYLEPHRINE HCL 10 MG/ML IJ SOLN
30.0000 ug/min | INTRAMUSCULAR | Status: DC
  Administered 2014-05-22: 30 ug/min via INTRAVENOUS
  Administered 2014-05-23: 75 ug/min via INTRAVENOUS
  Administered 2014-05-23: 40 ug/min via INTRAVENOUS
  Filled 2014-05-22 (×3): qty 1

## 2014-05-22 MED ORDER — LORAZEPAM 2 MG/ML IJ SOLN: 2.0000 mg | Freq: Once | INTRAMUSCULAR | Status: AC

## 2014-05-22 MED ORDER — INSULIN ASPART 100 UNIT/ML ~~LOC~~ SOLN
2.0000 [IU] | SUBCUTANEOUS | Status: DC
Start: 2014-05-22 — End: 2014-05-23
  Administered 2014-05-22: 4 [IU] via SUBCUTANEOUS
  Administered 2014-05-23 (×2): 2 [IU] via SUBCUTANEOUS

## 2014-05-22 MED ORDER — SODIUM CHLORIDE 0.9 % IV SOLN
1.0000 mg/h | INTRAVENOUS | Status: DC
  Administered 2014-05-22: 15 mg/h via INTRAVENOUS
  Filled 2014-05-22: qty 10

## 2014-05-22 MED ORDER — LORAZEPAM 2 MG/ML IJ SOLN
INTRAMUSCULAR | Status: AC
  Administered 2014-05-22: 2 mg
  Filled 2014-05-22: qty 1

## 2014-05-22 MED ORDER — SODIUM CHLORIDE 0.9 % IV SOLN
INTRAVENOUS | Status: DC
  Administered 2014-05-22 – 2014-05-23 (×3): via INTRAVENOUS

## 2014-05-22 MED ORDER — KETAMINE HCL 10 MG/ML IJ SOLN
1.0000 mg/kg | Freq: Once | INTRAMUSCULAR | Status: AC
  Administered 2014-05-23: 55 mg via INTRAVENOUS

## 2014-05-22 MED ORDER — SODIUM CHLORIDE 0.9 % IV SOLN
30.0000 mg/h | INTRAVENOUS | Status: DC
  Administered 2014-05-22: 30 mg/h via INTRAVENOUS
  Administered 2014-05-22: 20 mg/h via INTRAVENOUS
  Filled 2014-05-22: qty 20

## 2014-05-22 MED ORDER — SODIUM CHLORIDE 0.9 % IV BOLUS (SEPSIS)
500.0000 mL | Freq: Once | INTRAVENOUS | Status: AC
  Administered 2014-05-22: 500 mL via INTRAVENOUS

## 2014-05-22 MED ORDER — MIDAZOLAM BOLUS VIA INFUSION
15.0000 mg | Freq: Once | INTRAVENOUS | Status: AC
  Administered 2014-05-22: 15 mg via INTRAVENOUS
  Filled 2014-05-22: qty 15

## 2014-05-22 MED ORDER — SODIUM BICARBONATE 8.4 % IV SOLN
50.0000 meq | Freq: Once | INTRAVENOUS | Status: AC
  Administered 2014-05-22: 50 meq via INTRAVENOUS
  Filled 2014-05-22: qty 50

## 2014-05-22 MED ORDER — LEVETIRACETAM IN NACL 1500 MG/100ML IV SOLN
1500.0000 mg | Freq: Two times a day (BID) | INTRAVENOUS | Status: DC
  Administered 2014-05-22 – 2014-05-25 (×7): 1500 mg via INTRAVENOUS
  Filled 2014-05-22 (×8): qty 100

## 2014-05-22 MED ORDER — SODIUM CHLORIDE 0.9 % IV SOLN
20.0000 mg/h | INTRAVENOUS | Status: DC
  Administered 2014-05-22: 15 mg/h via INTRAVENOUS

## 2014-05-22 MED ORDER — SODIUM CHLORIDE 0.9 % IV SOLN
1.0000 g | Freq: Once | INTRAVENOUS | Status: AC
  Administered 2014-05-22: 1 g via INTRAVENOUS
  Filled 2014-05-22: qty 10

## 2014-05-22 MED ORDER — SODIUM CHLORIDE 0.9 % IV SOLN
2.0000 mg/kg/h | INTRAVENOUS | Status: DC
  Administered 2014-05-22: 2 mg/kg/h via INTRAVENOUS
  Filled 2014-05-22: qty 2

## 2014-05-22 MED ORDER — VASOPRESSIN 20 UNIT/ML IV SOLN
0.0300 [IU]/min | INTRAVENOUS | Status: DC
  Administered 2014-05-22 – 2014-05-23 (×2): 0.03 [IU]/min via INTRAVENOUS
  Filled 2014-05-22 (×2): qty 2

## 2014-05-22 NOTE — Progress Notes (Signed)
eLink Physician-Brief Progress Note Patient Name: Joya Martyraula Vetter DOB: [birth year] MRN: [id number]   Date of Service  05/22/2014  HPI/Events of Note  Pt rewarming too rapidly  eICU Interventions  Culture blood and resp cult      Intervention Category Intermediate Interventions: Infection - evaluation and management  Shan Levansatrick Vernadine Coombs 05/22/2014, 8:52 PM

## 2014-05-22 NOTE — Progress Notes (Signed)
LTVM up and running. No skin breakdown

## 2014-05-22 NOTE — Progress Notes (Signed)
eLink Physician-Brief Progress Note Patient Name: Joya Martyraula Cobin DOB: 1955/11/14 MRN: [id number]   Date of Service  05/22/2014  HPI/Events of Note  Low output, but normal crt,   eICU Interventions  Flush foley, bladder scan, ensure no obstruction     Intervention Category Intermediate Interventions: Diagnostic test evaluation;Oliguria - evaluation and management  Kory Panjwani J. 05/22/2014, 1:15 AM

## 2014-05-22 NOTE — Progress Notes (Signed)
eLink Physician-Brief Progress Note Patient Name: [name] Solis DOB: 07/04/1955 MRN: [id number]   Date of Service  05/22/2014  HPI/Events of Note  k low  eICU Interventions  supp aggresive     Intervention Category Major Interventions: Electrolyte abnormality - evaluation and management  Nelda BucksFEINSTEIN,DANIEL J. 05/22/2014, 12:36 AM

## 2014-05-22 NOTE — Progress Notes (Addendum)
PULMONARY / CRITICAL CARE MEDICINE HISTORY AND PHYSICAL EXAMINATION   Name: [name] Solis MRN: [id number] DOB: [birth year]    ADMISSION DATE:  05/14/2014  PRIMARY SERVICE: PCCM  CHIEF COMPLAINT:  Vfib arrest  BRIEF PATIENT DESCRIPTION:  59 yo female with VF cardiac arrest with 10 minutes before ROSC.  SIGNIFICANT EVENTS:  1/10 Admitted, Vfib arrest, EKG with ST depressions, Hypothermia protocol started  STUDIES: 1/10 CT head >> mild frontal atrophy b/l, Rt nasal septal deviation 1/10 Lt heart cath >> minimal, non obstructive CAD  SUBJECTIVE:   VITAL SIGNS: Temp:  [87.3 F (30.7 C)-97 F (36.1 C)] 93.4 F (34.1 C) (01/11 0800) Pulse Rate:  [62-101] 62 (01/11 0846) Resp:  [0-18] 14 (01/11 0846) BP: (86-132)/(71-111) 110/72 mmHg (01/11 0846) SpO2:  [97 %-100 %] 100 % (01/11 0846) FiO2 (%):  [40 %] 40 % (01/11 0846) Weight:  [121 lb 4.1 oz (55 kg)-122 lb 8.9 oz (55.59 kg)] 121 lb 4.1 oz (55 kg) (01/10 1100) HEMODYNAMICS: CVP:  [8 mmHg-12 mmHg] 9 mmHg VENTILATOR SETTINGS: Vent Mode:  [-] PRVC FiO2 (%):  [40 %] 40 % Set Rate:  [14 bmp] 14 bmp Vt Set:  [480 mL] 480 mL PEEP:  [5 cmH20] 5 cmH20 Plateau Pressure:  [14 cmH20-16 cmH20] 16 cmH20 INTAKE / OUTPUT: Intake/Output      01/10 0701 - 01/11 0700 01/11 0701 - 01/12 0700   I.V. (mL/kg) 3845.5 (69.9) 59.7 (1.1)   NG/GT 140    IV Piggyback 700    Total Intake(mL/kg) 4685.5 (85.2) 59.7 (1.1)   Urine (mL/kg/hr) 735 (0.6) 15 (0.1)   Total Output 735 15   Net +3950.5 +44.7          PHYSICAL EXAMINATION: General: paralyzed Neuro: RASS -5, intermittent facial twitching HEENT:  Pupils reactive Cardiovascular: regular Lungs: no wheeze Abdomen: soft Skin:  No rashes MSK: no edema  LABS:  CBC  Recent Labs Lab 06/02/2014 0604  06/02/2014 1350 05/22/14 0400 05/22/14 0832  WBC 9.5  --   --  12.1*  --   HGB 12.7  < > 13.9 12.1 14.6  HCT 36.4  < > 41.0 35.2* 43.0  PLT 105*  --   --  100*  --   < > = values in this  interval not displayed.   Coag's  Recent Labs Lab 06/02/2014 0604 05/16/2014 1015 06/06/2014 1514 05/31/2014 2245  APTT 34 45* 28 28  INR 1.23  --   --  0.98   BMET  Recent Labs Lab 05/22/14 0025 05/22/14 0400 05/22/14 0600 05/22/14 0832  NA 136 134* 134* 137  K 4.1 5.4* 6.3* 6.1*  CL 111 112 110 110  CO2 19 15* 21  --   BUN CREATININE 0.77 0.76 0.85 0.60  GLUCOSE 132* 169* 155* 152*   Electrolytes  Recent Labs Lab 05/22/14 0025 05/22/14 0400 05/22/14 0600  CALCIUM 7.5* 7.0* 7.2*   Sepsis Markers  Recent Labs Lab 05/30/2014 0611  LATICACIDVEN 9.93*   ABG  Recent Labs Lab 05/22/2014 0627 05/22/14 0345  PHART 7.274* 7.322*  PCO2ART 49.8* 29.1*  PO2ART 505.0* 133.0*   Liver Enzymes  Recent Labs Lab 06/10/2014 0604  AST 159*  ALT 71*  ALKPHOS 93  BILITOT 1.7*  ALBUMIN 2.4*   Cardiac Enzymes  Recent Labs Lab 06/08/2014 1315 06/06/2014 1914 05/22/14 0025  TROPONINI 5.81* 5.26* 4.67*   Glucose  Recent Labs Lab 05/14/2014 2026 05/18/2014 2140 05/15/2014 2221 05/23/2014 2352 05/22/14 0023 05/22/14 0400  GLUCAP 161* 155* 154* 123* 129* 149*    Imaging Ct Head Wo Contrast  05/19/2014   CLINICAL DATA:  Acute cardiac arrest earlier in the day  EXAM: CT HEAD WITHOUT CONTRAST  TECHNIQUE: Contiguous axial images were obtained from the base of the skull through the vertex without intravenous contrast.  COMPARISON:  None.  FINDINGS: The ventricles are normal in size and configuration. There is, however, mild frontal atrophy bilaterally. There is no intracranial mass, hemorrhage, extra-axial fluid collection, or midline shift. There remains gray -white differentiation without appreciable edema by CT. No focal gray-white compartment lesions are identified. No acute infarct is apparent. The bony calvarium appears intact. The mastoid air cells are clear. There is diffuse opacification of the visualize left maxillary antrum. There is rightward deviation of the  nasal septum. There is opacification of several ethmoid air cells on the left. Patient is intubated.  IMPRESSION: Mild frontal atrophy bilaterally. No intracranial edema is appreciable. There remains gray-white compartment differentiation. No intracranial mass, hemorrhage, or extra-axial fluid. No acute infarct apparent. Paranasal sinus disease. Rightward deviation nasal septum.   Electronically Signed   By: Bretta BangWilliam  Woodruff M.D.   On: 05/14/2014 08:41   Dg Chest Portable 1 View  06/11/2014   CLINICAL DATA:  Hypoxia.  Status post cardiac arrest  EXAM: PORTABLE CHEST - 1 VIEW  COMPARISON:  Study obtained earlier in the day  FINDINGS: Endotracheal tube tip is 4.1 cm above the carina. Nasogastric tube tip and side port are below the diaphragm. Central catheter tip is in the superior vena cava. No pneumothorax. Lungs are clear. Heart size and pulmonary vascularity are normal. No adenopathy.  IMPRESSION: Tube and catheter positions as described without pneumothorax. No edema or consolidation.   Electronically Signed   By: Bretta BangWilliam  Woodruff M.D.   On: 05/16/2014 08:09   Dg Chest Port 1 View  05/29/2014   CLINICAL DATA:  Code STEMI.  Intubated.  NG tube placed.  EXAM: PORTABLE CHEST - 1 VIEW  COMPARISON:  None.  FINDINGS: Endotracheal tube tip measures 4.5 cm above the carinal. Enteric tube tip is off the field of view but below the left hemidiaphragm. Normal heart size and pulmonary vascularity. Mediastinal contours appear intact. Lungs appear clear and expanded.  IMPRESSION: Appliances appear in satisfactory location. No evidence of active pulmonary disease.   Electronically Signed   By: Burman NievesWilliam  Stevens M.D.   On: 05/23/2014 06:52   ASSESSMENT / PLAN:  PULMONARY ETT 1/10 >> A: Acute respiratory failure 2nd to cardiac arrest. P:   Full vent support F/u CXR, ABG  CARDIOVASCULAR A: VF arrest with cardiogenic shock >> no significant CAD on cath. P:   Monitor hemodynamics Continue pressors while on  hypothermia  RENAL A: AKI 2nd to cardiogenic shock >> resolved. Metabolic acidosis >> improved. Hypokalemia >> resolved. Hyperkalemia 1/11. P:   D/c KCL replacement Calcium/HCO3 F/u BMET  GASTROINTESTINAL A: Shock liver. Nutrition. P:   F/u LFT Protonix for SUP Tube feeds once off hypothermia  HEMATOLOGIC A: Thrombocytopenia. P:   Follow CBC SQ heparin for DVT prevention  INFECTIOUS A: No evidence infection. P:   Monitor clinically  ENDOCRINE A: Hyperglycemia. P:   SSI  NEUROLOGIC A: Acute encephalopathy 2nd to cardiac arrest. P:   Hypothermia protocol started 1/10 RASS goal -5 while cooling Might need neuro assessment after rewarming F/u EEG 1/11  No family at bedside 1/11.  Family conference due 1/17:  CC time 40 minutes.  Coralyn HellingVineet Leasha Goldberger, MD [facility]  05/22/2014, 9:09 AM Pager:  [phone] After 3pm call: [phone]

## 2014-05-22 NOTE — Progress Notes (Signed)
Pt. BP slowly decreasing with MAPs less than 80. Increased Levo with no result, called Elink MD and will start vasopressin. Will continue to monitor.

## 2014-05-22 NOTE — Care Management Note (Signed)
    Page 1 of 1   05/22/2014     10:08:11 AM CARE MANAGEMENT NOTE 05/22/2014  [facility]  [name]   Account Number:  [id number]  Date Initiated:  05/22/2014  Documentation initiated by:  [name] Solis  Subjective/Objective Assessment:   adm w cardiac arrest-vent     Action/Plan:   lives w husband   Anticipated DC Date:     Anticipated DC Plan:           Choice offered to / List presented to:             Status of service:   Medicare Important Message given?   (If response is "NO", the following Medicare IM given date fields will be blank) Date Medicare IM given:   Medicare IM given by:   Date Additional Medicare IM given:   Additional Medicare IM given by:    Discharge Disposition:    Per UR Regulation:  Reviewed for med. necessity/level of care/duration of stay  If discussed at Long Length of Stay Meetings, dates discussed:    Comments:

## 2014-05-22 NOTE — Progress Notes (Signed)
EEG Completed; Results Pending  

## 2014-05-22 NOTE — Progress Notes (Signed)
Patient continues to have GPEDs with brief periods of evolution. She also continues to not be meeting MAP goals despite two pressor agents. I think that in this setting, using propofol or pentobarbital would likely compound her hypotension. In this setting, ketamine can be used as a relatively pressor sparing agent and I feel it may be the best option.   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   1) ketamine 2mg /kg load and rate of 2mg /kg/hr   This patient is critically ill and at significant risk of neurological worsening, death and care requires constant monitoring of vital signs, hemodynamics,respiratory and cardiac monitoring, neurological assessment, discussion with family, other specialists and medical decision making of high complexity. I spent 45 minutes of neurocritical care time  in the care of  this patient.  Ritta SlotMcNeill Kirkpatrick, MD [facility] [phone]  If 7pm- 7am, please page neurology on call as listed in AMION. 05/22/2014  8:28 PM

## 2014-05-22 NOTE — Consult Note (Signed)
Reason for Consult:BYRUM, R  HPI:                                                                                                                                          [name] Solis is an 59 y.o. female with no documented prior medical history who collapsed at home on 05/21/2013 with heart attack arrest. CPR was immediately started with ROSC after about 10 minutes. Patient underwent hypothermia protocol and is currently in the process of being rewarmed. She has remained unresponsive and intubated on mechanical ventilation. She was found to be hyperglycemic as well as hypokalemic on admission. EEG was obtained earlier today which showed subclinical generalized status epilepticus. She was on Versed at 4 mg per hour. She was given Ativan 2 mg IV and a loading dose of Keppra 1000 mg IV. Seizure activity continued and was unchanged. Increase in Versed in incremental doses up to 15 g/h had little effect on EEG pattern. She was loaded with Vimpat 200 mg IV followed by 100 mg every 12 hours. Repeat dose of Keppra 1000 mg has been ordered, along with 1500 mg every 12 hours for maintenance.  History reviewed. No pertinent past medical history.  Past Surgical History  Procedure Laterality Date  . Left heart cath N/A 05/29/2014    Procedure: LEFT HEART CATH;  Surgeon: Blane Ohara, MD;  Location: [facility] CATH LAB;  Service: Cardiovascular;  Laterality: N/A;    No family history on file.  Social History:  reports that she has never smoked. She does not have any smokeless tobacco history on file. Her alcohol and drug histories are not on file.  No Known Allergies  MEDICATIONS:                                                                                                                     I have reviewed the patient's current medications.   ROS:  History obtained from  unobtainable from patient due to mental status  Blood pressure 84/63, pulse 85, temperature 94.6 F (34.8 C), temperature source Core (Comment), resp. rate 14, height 5' 6"  (1.676 m), weight 55 kg (121 lb 4.1 oz), SpO2 100 %. Appearance was that of middle-aged lady of slender to medium build who was intubated and on mechanical ventilation. No spontaneous respirations were noted. Patient did not react to noxious stimuli.  Neurologic Examination:                                                                                                      Pupils were equal and did not react to light. Extraocular movements are absent with oculocephalic maneuvers. Corneal reflexes were absent. No facial asymmetry was noted. Muscle tone was flaccid throughout. Patient had no spontaneous movements and no abnormal posturing. Deep tendon reflexes were 1+ and symmetrical. Plantar responses were mute bilaterally.  No results found for: CHOL  Results for orders placed or performed during the hospital encounter of 05/18/2014 (from the past 48 hour(s))  CBC with Differential     Status: Abnormal   Collection Time: 05/28/2014  6:04 AM  Result Value Ref Range   WBC 9.5 4.0 - 10.5 K/uL   RBC 3.37 (L) 3.87 - 5.11 MIL/uL   Hemoglobin 12.7 12.0 - 15.0 g/dL   HCT 36.4 36.0 - 46.0 %   MCV 108.0 (H) 78.0 - 100.0 fL   MCH 37.7 (H) 26.0 - 34.0 pg   MCHC 34.9 30.0 - 36.0 g/dL   RDW 12.3 11.5 - 15.5 %   Platelets 105 (L) 150 - 400 K/uL    Comment: REPEATED TO VERIFY PLATELET COUNT CONFIRMED BY SMEAR    Neutrophils Relative % 45 43 - 77 %   Neutro Abs 4.3 1.7 - 7.7 K/uL   Lymphocytes Relative 44 12 - 46 %   Lymphs Abs 4.2 (H) 0.7 - 4.0 K/uL   Monocytes Relative 9 3 - 12 %   Monocytes Absolute 0.8 0.1 - 1.0 K/uL   Eosinophils Relative 2 0 - 5 %   Eosinophils Absolute 0.2 0.0 - 0.7 K/uL   Basophils Relative 1 0 - 1 %   Basophils Absolute 0.1 0.0 - 0.1 K/uL  Comprehensive metabolic panel     Status: Abnormal    Collection Time: 06/10/2014  6:04 AM  Result Value Ref Range   Sodium 135 135 - 145 mmol/L    Comment: Please note change in reference range.   Potassium 2.3 (LL) 3.5 - 5.1 mmol/L    Comment: Please note change in reference range. REPEATED TO VERIFY CRITICAL RESULT CALLED TO, READ BACK BY AND VERIFIED WITH: JACOBELLEBRN 0715 06/01/2014 MCCAULEG    Chloride 97 96 - 112 mEq/L   CO2 21 19 - 32 mmol/L   Glucose, Bld 406 (H) 70 - 99 mg/dL   BUN 8 6 - 23 mg/dL   Creatinine, Ser 1.28 (H) 0.50 - 1.10 mg/dL   Calcium 8.0 (L) 8.4 - 10.5 mg/dL   Total Protein 5.0 (L) 6.0 - 8.3 g/dL  Albumin 2.4 (L) 3.5 - 5.2 g/dL   AST 159 (H) 0 - 37 U/L   ALT 71 (H) 0 - 35 U/L   Alkaline Phosphatase 93 39 - 117 U/L   Total Bilirubin 1.7 (H) 0.3 - 1.2 mg/dL   GFR calc non Af Amer 45 (L) >90 mL/min   GFR calc Af Amer 53 (L) >90 mL/min    Comment: (NOTE) The eGFR has been calculated using the CKD EPI equation. This calculation has not been validated in all clinical situations. eGFR's persistently <90 mL/min signify possible Chronic Kidney Disease.    Anion gap 17 (H) 5 - 15  Troponin I     Status: Abnormal   Collection Time: 05/12/2014  6:04 AM  Result Value Ref Range   Troponin I 0.09 (H) <0.031 ng/mL    Comment:        PERSISTENTLY INCREASED TROPONIN VALUES IN THE RANGE OF 0.04-0.49 ng/mL CAN BE SEEN IN:       -UNSTABLE ANGINA       -CONGESTIVE HEART FAILURE       -MYOCARDITIS       -CHEST TRAUMA       -ARRYHTHMIAS       -LATE PRESENTING MYOCARDIAL INFARCTION       -COPD   CLINICAL FOLLOW-UP RECOMMENDED. Please note change in reference range.   APTT     Status: None   Collection Time: 05/27/2014  6:04 AM  Result Value Ref Range   aPTT 34 24 - 37 seconds  Protime-INR     Status: Abnormal   Collection Time: 06/03/2014  6:04 AM  Result Value Ref Range   Prothrombin Time 15.6 (H) 11.6 - 15.2 seconds   INR 1.23 0.00 - 1.49  Ethanol     Status: None   Collection Time: 05/13/2014  6:04 AM  Result Value  Ref Range   Alcohol, Ethyl (B) <5 0 - 9 mg/dL    Comment:        LOWEST DETECTABLE LIMIT FOR SERUM ALCOHOL IS 11 mg/dL FOR MEDICAL PURPOSES ONLY   I-Stat CG4 Lactic Acid, ED     Status: Abnormal   Collection Time: 05/12/2014  6:11 AM  Result Value Ref Range   Lactic Acid, Venous 9.93 (H) 0.5 - 2.2 mmol/L  Urine rapid drug screen (hosp performed)     Status: Abnormal   Collection Time: 05/28/2014  6:19 AM  Result Value Ref Range   Opiates NONE DETECTED NONE DETECTED   Cocaine NONE DETECTED NONE DETECTED   Benzodiazepines POSITIVE (A) NONE DETECTED   Amphetamines NONE DETECTED NONE DETECTED   Tetrahydrocannabinol POSITIVE (A) NONE DETECTED   Barbiturates NONE DETECTED NONE DETECTED    Comment:        DRUG SCREEN FOR MEDICAL PURPOSES ONLY.  IF CONFIRMATION IS NEEDED FOR ANY PURPOSE, NOTIFY LAB WITHIN 5 DAYS.        LOWEST DETECTABLE LIMITS FOR URINE DRUG SCREEN Drug Class       Cutoff (ng/mL) Amphetamine      1000 Barbiturate      200 Benzodiazepine   269 Tricyclics       485 Opiates          300 Cocaine          300 THC              50   Urinalysis, Routine w reflex microscopic     Status: Abnormal   Collection Time: 06/08/2014  6:19 AM  Result  Value Ref Range   Color, Urine YELLOW YELLOW   APPearance CLOUDY (A) CLEAR   Specific Gravity, Urine 1.013 1.005 - 1.030   pH 7.5 5.0 - 8.0   Glucose, UA >1000 (A) NEGATIVE mg/dL   Hgb urine dipstick MODERATE (A) NEGATIVE   Bilirubin Urine NEGATIVE NEGATIVE   Ketones, ur NEGATIVE NEGATIVE mg/dL   Protein, ur >300 (A) NEGATIVE mg/dL   Urobilinogen, UA 1.0 0.0 - 1.0 mg/dL   Nitrite NEGATIVE NEGATIVE   Leukocytes, UA NEGATIVE NEGATIVE  Urine microscopic-add on     Status: Abnormal   Collection Time: 05/24/2014  6:19 AM  Result Value Ref Range   Squamous Epithelial / LPF FEW (A) RARE   WBC, UA 11-20 <3 WBC/hpf   RBC / HPF 11-20 <3 RBC/hpf   Bacteria, UA FEW (A) RARE   Casts HYALINE CASTS (A) NEGATIVE  I-Stat arterial blood gas, ED      Status: Abnormal   Collection Time: 05/23/2014  6:27 AM  Result Value Ref Range   pH, Arterial 7.274 (L) 7.350 - 7.450   pCO2 arterial 49.8 (H) 35.0 - 45.0 mmHg   pO2, Arterial 505.0 (H) 80.0 - 100.0 mmHg   Bicarbonate 23.1 20.0 - 24.0 mEq/L   TCO2 25 0 - 100 mmol/L   O2 Saturation 100.0 %   Acid-base deficit 4.0 (H) 0.0 - 2.0 mmol/L   Patient temperature 98.6 F    Collection site RADIAL, ALLEN'S TEST ACCEPTABLE    Drawn by RT    Sample type ARTERIAL   Glucose, capillary     Status: Abnormal   Collection Time: 06/10/2014  9:55 AM  Result Value Ref Range   Glucose-Capillary 179 (H) 70 - 99 mg/dL   Comment 1 Venous Sample   I-STAT, chem 8     Status: Abnormal   Collection Time: 06/03/2014  9:59 AM  Result Value Ref Range   Sodium 141 135 - 145 mmol/L   Potassium 2.0 (LL) 3.5 - 5.1 mmol/L   Chloride 104 96 - 112 mEq/L   BUN 7 6 - 23 mg/dL   Creatinine, Ser 0.60 0.50 - 1.10 mg/dL   Glucose, Bld 188 (H) 70 - 99 mg/dL   Calcium, Ion 1.01 (L) 1.12 - 1.23 mmol/L   TCO2 18 0 - 100 mmol/L   Hemoglobin 12.2 12.0 - 15.0 g/dL   HCT 36.0 36.0 - 46.0 %  Troponin I     Status: Abnormal   Collection Time: 05/20/2014 10:15 AM  Result Value Ref Range   Troponin I 5.82 (HH) <0.031 ng/mL    Comment:        POSSIBLE MYOCARDIAL ISCHEMIA. SERIAL TESTING RECOMMENDED. Please note change in reference range. REPEATED TO VERIFY CRITICAL RESULT CALLED TO, READ BACK BY AND VERIFIED WITH: Fayrene Fearing RN AT 1223 06/03/2014 BY [facility]   Basic metabolic panel     Status: Abnormal   Collection Time: 05/20/2014 10:15 AM  Result Value Ref Range   Sodium 138 135 - 145 mmol/L    Comment: Please note change in reference range.   Potassium 2.1 (LL) 3.5 - 5.1 mmol/L    Comment: Please note change in reference range. REPEATED TO VERIFY CRITICAL RESULT CALLED TO, READ BACK BY AND VERIFIED WITH: Fayrene Fearing RN AT 1223 06/03/2014 BY WOOLLENK    Chloride 108 96 - 112 mEq/L    Comment: DELTA CHECK NOTED   CO2 19  19 - 32 mmol/L   Glucose, Bld 196 (H) 70 - 99 mg/dL  BUN 8 6 - 23 mg/dL   Creatinine, Ser 0.79 0.50 - 1.10 mg/dL    Comment: DELTA CHECK NOTED   Calcium 6.8 (L) 8.4 - 10.5 mg/dL   GFR calc non Af Amer >90 >90 mL/min   GFR calc Af Amer >90 >90 mL/min    Comment: (NOTE) The eGFR has been calculated using the CKD EPI equation. This calculation has not been validated in all clinical situations. eGFR's persistently <90 mL/min signify possible Chronic Kidney Disease.    Anion gap 11 5 - 15  APTT now and repeat in 8 hours     Status: Abnormal   Collection Time: 06/06/2014 10:15 AM  Result Value Ref Range   aPTT 45 (H) 24 - 37 seconds    Comment:        IF BASELINE aPTT IS ELEVATED, SUGGEST PATIENT RISK ASSESSMENT BE USED TO DETERMINE APPROPRIATE ANTICOAGULANT THERAPY.   Glucose, capillary     Status: Abnormal   Collection Time: 06/04/2014 11:05 AM  Result Value Ref Range   Glucose-Capillary 199 (H) 70 - 99 mg/dL   Comment 1 Venous Sample   MRSA PCR Screening     Status: None   Collection Time: 05/18/2014 11:30 AM  Result Value Ref Range   MRSA by PCR NEGATIVE NEGATIVE    Comment:        The GeneXpert MRSA Assay (FDA approved for NASAL specimens only), is one component of a comprehensive MRSA colonization surveillance program. It is not intended to diagnose MRSA infection nor to guide or monitor treatment for MRSA infections.   Glucose, capillary     Status: Abnormal   Collection Time: 05/24/2014 12:19 PM  Result Value Ref Range   Glucose-Capillary 219 (H) 70 - 99 mg/dL   Comment 1 Venous Sample   Troponin I     Status: Abnormal   Collection Time: 05/12/2014  1:15 PM  Result Value Ref Range   Troponin I 5.81 (HH) <0.031 ng/mL    Comment:        POSSIBLE MYOCARDIAL ISCHEMIA. SERIAL TESTING RECOMMENDED. Please note change in reference range. CRITICAL VALUE NOTED.  VALUE IS CONSISTENT WITH PREVIOUSLY REPORTED AND CALLED VALUE.   Basic metabolic panel     Status: Abnormal    Collection Time: 06/07/2014  1:15 PM  Result Value Ref Range   Sodium 138 135 - 145 mmol/L    Comment: Please note change in reference range.   Potassium 3.3 (L) 3.5 - 5.1 mmol/L    Comment: Please note change in reference range. DELTA CHECK NOTED    Chloride 108 96 - 112 mEq/L   CO2 19 19 - 32 mmol/L   Glucose, Bld 243 (H) 70 - 99 mg/dL   BUN 9 6 - 23 mg/dL   Creatinine, Ser 0.88 0.50 - 1.10 mg/dL   Calcium 7.7 (L) 8.4 - 10.5 mg/dL   GFR calc non Af Amer 72 (L) >90 mL/min   GFR calc Af Amer 83 (L) >90 mL/min    Comment: (NOTE) The eGFR has been calculated using the CKD EPI equation. This calculation has not been validated in all clinical situations. eGFR's persistently <90 mL/min signify possible Chronic Kidney Disease.    Anion gap 11 5 - 15  Glucose, capillary     Status: Abnormal   Collection Time: 05/28/2014  1:27 PM  Result Value Ref Range   Glucose-Capillary 222 (H) 70 - 99 mg/dL   Comment 1 Venous Sample   I-STAT, chem 8  Status: Abnormal   Collection Time: 05/17/2014  1:50 PM  Result Value Ref Range   Sodium 139 135 - 145 mmol/L   Potassium 2.9 (L) 3.5 - 5.1 mmol/L   Chloride 103 96 - 112 mEq/L   BUN 9 6 - 23 mg/dL   Creatinine, Ser 0.60 0.50 - 1.10 mg/dL   Glucose, Bld 235 (H) 70 - 99 mg/dL   Calcium, Ion 1.01 (L) 1.12 - 1.23 mmol/L   TCO2 16 0 - 100 mmol/L   Hemoglobin 13.9 12.0 - 15.0 g/dL   HCT 41.0 36.0 - 46.0 %  Glucose, capillary     Status: Abnormal   Collection Time: 05/12/2014  2:37 PM  Result Value Ref Range   Glucose-Capillary 185 (H) 70 - 99 mg/dL   Comment 1 Venous Sample   APTT now and repeat in 8 hours     Status: None   Collection Time: 05/23/2014  3:14 PM  Result Value Ref Range   aPTT 28 24 - 37 seconds  Glucose, capillary     Status: Abnormal   Collection Time: 05/15/2014  3:48 PM  Result Value Ref Range   Glucose-Capillary 159 (H) 70 - 99 mg/dL   Comment 1 Venous Sample   Glucose, capillary     Status: Abnormal   Collection Time: 05/23/2014   5:03 PM  Result Value Ref Range   Glucose-Capillary 114 (H) 70 - 99 mg/dL   Comment 1 Venous Sample   Glucose, capillary     Status: Abnormal   Collection Time: 06/05/2014  6:03 PM  Result Value Ref Range   Glucose-Capillary 120 (H) 70 - 99 mg/dL   Comment 1 Venous Sample   Troponin I     Status: Abnormal   Collection Time: 06/03/2014  7:14 PM  Result Value Ref Range   Troponin I 5.26 (HH) <0.031 ng/mL    Comment:        POSSIBLE MYOCARDIAL ISCHEMIA. SERIAL TESTING RECOMMENDED. Please note change in reference range. REPEATED TO VERIFY CRITICAL RESULT CALLED TO, READ BACK BY AND VERIFIED WITH: J.ROBERTS,RN 05/27/2014 @2050  BY V.WILKINS   Basic metabolic panel     Status: Abnormal   Collection Time: 05/19/2014  7:14 PM  Result Value Ref Range   Sodium 138 135 - 145 mmol/L    Comment: Please note change in reference range.   Potassium 3.7 3.5 - 5.1 mmol/L    Comment: Please note change in reference range.   Chloride 107 96 - 112 mEq/L   CO2 16 (L) 19 - 32 mmol/L   Glucose, Bld 135 (H) 70 - 99 mg/dL   BUN 10 6 - 23 mg/dL   Creatinine, Ser 0.77 0.50 - 1.10 mg/dL   Calcium 7.8 (L) 8.4 - 10.5 mg/dL   GFR calc non Af Amer >90 >90 mL/min   GFR calc Af Amer >90 >90 mL/min    Comment: (NOTE) The eGFR has been calculated using the CKD EPI equation. This calculation has not been validated in all clinical situations. eGFR's persistently <90 mL/min signify possible Chronic Kidney Disease.    Anion gap 15 5 - 15  Glucose, capillary     Status: Abnormal   Collection Time: 06/03/2014  7:14 PM  Result Value Ref Range   Glucose-Capillary 130 (H) 70 - 99 mg/dL   Comment 1 Venous Sample   Glucose, capillary     Status: Abnormal   Collection Time: 05/20/2014  8:26 PM  Result Value Ref Range   Glucose-Capillary 161 (H)  70 - 99 mg/dL  Basic metabolic panel     Status: Abnormal   Collection Time: 06/04/2014  8:50 PM  Result Value Ref Range   Sodium 138 135 - 145 mmol/L    Comment: Please note change  in reference range.   Potassium 3.2 (L) 3.5 - 5.1 mmol/L    Comment: Please note change in reference range.   Chloride 108 96 - 112 mEq/L   CO2 15 (L) 19 - 32 mmol/L   Glucose, Bld 169 (H) 70 - 99 mg/dL   BUN 11 6 - 23 mg/dL   Creatinine, Ser 0.74 0.50 - 1.10 mg/dL   Calcium 7.9 (L) 8.4 - 10.5 mg/dL   GFR calc non Af Amer >90 >90 mL/min   GFR calc Af Amer >90 >90 mL/min    Comment: (NOTE) The eGFR has been calculated using the CKD EPI equation. This calculation has not been validated in all clinical situations. eGFR's persistently <90 mL/min signify possible Chronic Kidney Disease.    Anion gap 15 5 - 15  Glucose, capillary     Status: Abnormal   Collection Time: 05/22/2014  9:40 PM  Result Value Ref Range   Glucose-Capillary 155 (H) 70 - 99 mg/dL   Comment 1 Arterial Sample   Glucose, capillary     Status: Abnormal   Collection Time: 05/15/2014 10:21 PM  Result Value Ref Range   Glucose-Capillary 154 (H) 70 - 99 mg/dL   Comment 1 Arterial Sample   Protime-INR     Status: None   Collection Time: 05/15/2014 10:45 PM  Result Value Ref Range   Prothrombin Time 13.1 11.6 - 15.2 seconds   INR 0.98 0.00 - 1.49  APTT     Status: None   Collection Time: 05/12/2014 10:45 PM  Result Value Ref Range   aPTT 28 24 - 37 seconds  Glucose, capillary     Status: Abnormal   Collection Time: 06/01/2014 11:52 PM  Result Value Ref Range   Glucose-Capillary 123 (H) 70 - 99 mg/dL   Comment 1 Arterial Sample   Glucose, capillary     Status: Abnormal   Collection Time: 05/22/14 12:23 AM  Result Value Ref Range   Glucose-Capillary 129 (H) 70 - 99 mg/dL   Comment 1 Arterial Sample   Troponin I     Status: Abnormal   Collection Time: 05/22/14 12:25 AM  Result Value Ref Range   Troponin I 4.67 (HH) <0.031 ng/mL    Comment:        POSSIBLE MYOCARDIAL ISCHEMIA. SERIAL TESTING RECOMMENDED. Please note change in reference range. REPEATED TO VERIFY CRITICAL VALUE NOTED.  VALUE IS CONSISTENT WITH  PREVIOUSLY REPORTED AND CALLED VALUE.   Basic metabolic panel     Status: Abnormal   Collection Time: 05/22/14 12:25 AM  Result Value Ref Range   Sodium 136 135 - 145 mmol/L    Comment: Please note change in reference range.   Potassium 4.1 3.5 - 5.1 mmol/L    Comment: Please note change in reference range. DELTA CHECK NOTED    Chloride 111 96 - 112 mEq/L   CO2 19 19 - 32 mmol/L   Glucose, Bld 132 (H) 70 - 99 mg/dL   BUN 11 6 - 23 mg/dL   Creatinine, Ser 0.77 0.50 - 1.10 mg/dL   Calcium 7.5 (L) 8.4 - 10.5 mg/dL   GFR calc non Af Amer >90 >90 mL/min   GFR calc Af Amer >90 >90 mL/min    Comment: (  NOTE) The eGFR has been calculated using the CKD EPI equation. This calculation has not been validated in all clinical situations. eGFR's persistently <90 mL/min signify possible Chronic Kidney Disease.    Anion gap 6 5 - 15  Blood gas, arterial     Status: Abnormal   Collection Time: 05/22/14  3:45 AM  Result Value Ref Range   FIO2 0.40 %   Delivery systems VENTILATOR    Mode PRESSURE REGULATED VOLUME CONTROL    VT 480 mL   Rate 14 resp/min   Peep/cpap 5.0 cm H20   pH, Arterial 7.322 (L) 7.350 - 7.450   pCO2 arterial 29.1 (L) 35.0 - 45.0 mmHg   pO2, Arterial 133.0 (H) 80.0 - 100.0 mmHg   Bicarbonate 14.7 (L) 20.0 - 24.0 mEq/L   TCO2 15.5 0 - 100 mmol/L   Acid-base deficit 10.2 (H) 0.0 - 2.0 mmol/L   O2 Saturation 98.3 %   Patient temperature 98.6    Collection site ARTERIAL LINE    Drawn by [id number]    Sample type ARTERIAL DRAW   Basic metabolic panel     Status: Abnormal   Collection Time: 05/22/14  4:00 AM  Result Value Ref Range   Sodium 134 (L) 135 - 145 mmol/L    Comment: Please note change in reference range.   Potassium 5.4 (H) 3.5 - 5.1 mmol/L    Comment: Please note change in reference range. DELTA CHECK NOTED    Chloride 112 96 - 112 mEq/L   CO2 15 (L) 19 - 32 mmol/L   Glucose, Bld 169 (H) 70 - 99 mg/dL   BUN 10 6 - 23 mg/dL   Creatinine, Ser 0.76 0.50 - 1.10  mg/dL   Calcium 7.0 (L) 8.4 - 10.5 mg/dL   GFR calc non Af Amer >90 >90 mL/min   GFR calc Af Amer >90 >90 mL/min    Comment: (NOTE) The eGFR has been calculated using the CKD EPI equation. This calculation has not been validated in all clinical situations. eGFR's persistently <90 mL/min signify possible Chronic Kidney Disease.    Anion gap 7 5 - 15  CBC     Status: Abnormal   Collection Time: 05/22/14  4:00 AM  Result Value Ref Range   WBC 12.1 (H) 4.0 - 10.5 K/uL   RBC 3.39 (L) 3.87 - 5.11 MIL/uL   Hemoglobin 12.1 12.0 - 15.0 g/dL   HCT 35.2 (L) 36.0 - 46.0 %   MCV 103.8 (H) 78.0 - 100.0 fL   MCH 35.7 (H) 26.0 - 34.0 pg   MCHC 34.4 30.0 - 36.0 g/dL   RDW 12.3 11.5 - 15.5 %   Platelets 100 (L) 150 - 400 K/uL    Comment: CONSISTENT WITH PREVIOUS RESULT  Glucose, capillary     Status: Abnormal   Collection Time: 05/22/14  4:00 AM  Result Value Ref Range   Glucose-Capillary 149 (H) 70 - 99 mg/dL   Comment 1 Arterial Sample   Basic metabolic panel     Status: Abnormal   Collection Time: 05/22/14  6:00 AM  Result Value Ref Range   Sodium 134 (L) 135 - 145 mmol/L    Comment: Please note change in reference range.   Potassium 6.3 (HH) 3.5 - 5.1 mmol/L    Comment: Please note change in reference range. REPEATED TO VERIFY CRITICAL RESULT CALLED TO, READ BACK BY AND VERIFIED WITH: R.GUNDRUM,RN 05/22/14 0739 BY BSLADE    Chloride 110 96 - 112 mEq/L  CO2 21 19 - 32 mmol/L   Glucose, Bld 155 (H) 70 - 99 mg/dL   BUN 11 6 - 23 mg/dL   Creatinine, Ser 0.85 0.50 - 1.10 mg/dL   Calcium 7.2 (L) 8.4 - 10.5 mg/dL   GFR calc non Af Amer 75 (L) >90 mL/min   GFR calc Af Amer 86 (L) >90 mL/min    Comment: (NOTE) The eGFR has been calculated using the CKD EPI equation. This calculation has not been validated in all clinical situations. eGFR's persistently <90 mL/min signify possible Chronic Kidney Disease.    Anion gap 3 (L) 5 - 15  I-STAT, chem 8     Status: Abnormal   Collection Time:  05/22/14  8:32 AM  Result Value Ref Range   Sodium 137 135 - 145 mmol/L   Potassium 6.1 (HH) 3.5 - 5.1 mmol/L   Chloride 110 96 - 112 mEq/L   BUN 12 6 - 23 mg/dL   Creatinine, Ser 0.60 0.50 - 1.10 mg/dL   Glucose, Bld 152 (H) 70 - 99 mg/dL   Calcium, Ion 1.07 (L) 1.12 - 1.23 mmol/L   TCO2 14 0 - 100 mmol/L   Hemoglobin 14.6 12.0 - 15.0 g/dL   HCT 43.0 36.0 - 46.0 %  Glucose, capillary     Status: Abnormal   Collection Time: 05/22/14  8:35 AM  Result Value Ref Range   Glucose-Capillary 130 (H) 70 - 99 mg/dL   Comment 1 Venous Sample   I-STAT, chem 8     Status: Abnormal   Collection Time: 05/22/14 12:06 PM  Result Value Ref Range   Sodium 139 135 - 145 mmol/L   Potassium 6.0 (H) 3.5 - 5.1 mmol/L   Chloride 108 96 - 112 mEq/L   BUN 11 6 - 23 mg/dL   Creatinine, Ser 0.60 0.50 - 1.10 mg/dL   Glucose, Bld 167 (H) 70 - 99 mg/dL   Calcium, Ion 1.09 (L) 1.12 - 1.23 mmol/L   TCO2 14 0 - 100 mmol/L   Hemoglobin 13.6 12.0 - 15.0 g/dL   HCT 40.0 36.0 - 46.0 %  Glucose, capillary     Status: Abnormal   Collection Time: 05/22/14 12:07 PM  Result Value Ref Range   Glucose-Capillary 153 (H) 70 - 99 mg/dL    Ct Head Wo Contrast  06/03/2014   CLINICAL DATA:  Acute cardiac arrest earlier in the day  EXAM: CT HEAD WITHOUT CONTRAST  TECHNIQUE: Contiguous axial images were obtained from the base of the skull through the vertex without intravenous contrast.  COMPARISON:  None.  FINDINGS: The ventricles are normal in size and configuration. There is, however, mild frontal atrophy bilaterally. There is no intracranial mass, hemorrhage, extra-axial fluid collection, or midline shift. There remains gray -white differentiation without appreciable edema by CT. No focal gray-white compartment lesions are identified. No acute infarct is apparent. The bony calvarium appears intact. The mastoid air cells are clear. There is diffuse opacification of the visualize left maxillary antrum. There is rightward  deviation of the nasal septum. There is opacification of several ethmoid air cells on the left. Patient is intubated.  IMPRESSION: Mild frontal atrophy bilaterally. No intracranial edema is appreciable. There remains gray-white compartment differentiation. No intracranial mass, hemorrhage, or extra-axial fluid. No acute infarct apparent. Paranasal sinus disease. Rightward deviation nasal septum.   Electronically Signed   By: Lowella Grip M.D.   On: 05/16/2014 08:41   Dg Chest Portable 1 View  05/20/2014   CLINICAL  DATA:  Hypoxia.  Status post cardiac arrest  EXAM: PORTABLE CHEST - 1 VIEW  COMPARISON:  Study obtained earlier in the day  FINDINGS: Endotracheal tube tip is 4.1 cm above the carina. Nasogastric tube tip and side port are below the diaphragm. Central catheter tip is in the superior vena cava. No pneumothorax. Lungs are clear. Heart size and pulmonary vascularity are normal. No adenopathy.  IMPRESSION: Tube and catheter positions as described without pneumothorax. No edema or consolidation.   Electronically Signed   By: Lowella Grip M.D.   On: 05/24/2014 08:09   Dg Chest Port 1 View  06/08/2014   CLINICAL DATA:  Code STEMI.  Intubated.  NG tube placed.  EXAM: PORTABLE CHEST - 1 VIEW  COMPARISON:  None.  FINDINGS: Endotracheal tube tip measures 4.5 cm above the carinal. Enteric tube tip is off the field of view but below the left hemidiaphragm. Normal heart size and pulmonary vascularity. Mediastinal contours appear intact. Lungs appear clear and expanded.  IMPRESSION: Appliances appear in satisfactory location. No evidence of active pulmonary disease.   Electronically Signed   By: Lucienne Capers M.D.   On: 05/29/2014 06:52     Assessment/Plan: 59 year old lady admitted following cardiac arrest with probable diffuse severe anoxic brain injury. Patient currently has refractory status epilepticus, most likely secondary to anoxic injury. Prognosis is unclear at this point, particularly  with patient currently still hypothermic and sedated with Versed.  Plan: 1. Continue to titrate Versed as needed for management of generalized status epilepticus. 2. Continue Keppra at 1500 mg every 12 hours and Vimpat at 100 mg every 12 hours. 3. A third EEG may be required, likely Depacon. 4. Continuous EEG and video monitoring 5. Repeat CT scan of the head when feasible.  We will continue to follow this patient closely with you.  C.R. Nicole Kindred, MD [facility] [phone]  05/22/2014, 4:17 PM

## 2014-05-22 NOTE — Progress Notes (Signed)
Spoke with Dr Roseanne RenoStewart; Bo MerinoLTM has been ordered. LTM day 1.

## 2014-05-22 NOTE — Progress Notes (Signed)
CRITICAL VALUE ALERT  Critical value received:K+ 6.3 - 6.1  Date of notification:  05/22/14  Time of notification:  0815  Critical value read back:Yes.    Nurse who received alert:   RG  MD notified (1st page):  Dr. Craige CottaSood 0830    Responding MD:Dr. Craige CottaSood  Time MD responded:  0830

## 2014-05-22 NOTE — Progress Notes (Signed)
Pt. Started rewarming at 1055. Witnessed X2 RN [employer]olly Church and [employer] Cartina Brousseau.

## 2014-05-23 ENCOUNTER — Other Ambulatory Visit: Payer: Self-pay

## 2014-05-23 ENCOUNTER — Inpatient Hospital Stay (HOSPITAL_COMMUNITY): Payer: No Typology Code available for payment source

## 2014-05-23 DIAGNOSIS — G931 Anoxic brain damage, not elsewhere classified: Secondary | ICD-10-CM

## 2014-05-23 LAB — BLOOD GAS, ARTERIAL
Acid-base deficit: 11.7 mmol/L — ABNORMAL HIGH (ref 0.0–2.0)
Acid-base deficit: 12.7 mmol/L — ABNORMAL HIGH (ref 0.0–2.0)
Bicarbonate: 14.2 mEq/L — ABNORMAL LOW (ref 20.0–24.0)
Bicarbonate: 11.8 mEq/L — ABNORMAL LOW (ref 20.0–24.0)
Drawn by: 418751
FIO2: 0.4 %
FIO2: 0.3 %
LHR: 24 {breaths}/min
MECHVT: 480 mL
MECHVT: 480 mL
O2 SAT: 99.4 %
O2 Saturation: 98.3 %
PATIENT TEMPERATURE: 98.6
PATIENT TEMPERATURE: 96.8
PEEP/CPAP: 5 cmH2O
PEEP: 5 cmH2O
PH ART: 7.243 — AB (ref 7.350–7.450)
PH ART: 7.371 (ref 7.350–7.450)
RATE: 14 resp/min
TCO2: 15.2 mmol/L (ref 0–100)
TCO2: 12.4 mmol/L (ref 0–100)
pCO2 arterial: 34 mmHg — ABNORMAL LOW (ref 35.0–45.0)
pCO2 arterial: 20.5 mmHg — ABNORMAL LOW (ref 35.0–45.0)
pO2, Arterial: 148 mmHg — ABNORMAL HIGH (ref 80.0–100.0)
pO2, Arterial: 134 mmHg — ABNORMAL HIGH (ref 80.0–100.0)

## 2014-05-23 LAB — BASIC METABOLIC PANEL
ANION GAP: 8 (ref 5–15)
Anion gap: 3 — ABNORMAL LOW (ref 5–15)
BUN: 10 mg/dL (ref 6–23)
BUN: 11 mg/dL (ref 6–23)
CALCIUM: 6.1 mg/dL — AB (ref 8.4–10.5)
CALCIUM: 7.1 mg/dL — AB (ref 8.4–10.5)
CO2: 13 mmol/L — ABNORMAL LOW (ref 19–32)
CO2: 14 mmol/L — ABNORMAL LOW (ref 19–32)
Chloride: 111 mEq/L (ref 96–112)
Chloride: 116 mEq/L — ABNORMAL HIGH (ref 96–112)
Creatinine, Ser: 0.83 mg/dL (ref 0.50–1.10)
Creatinine, Ser: 0.87 mg/dL (ref 0.50–1.10)
GFR calc Af Amer: 84 mL/min — ABNORMAL LOW (ref >90)
GFR calc Af Amer: 89 mL/min — ABNORMAL LOW (ref >90)
GFR, EST NON AFRICAN AMERICAN: 73 mL/min — AB (ref >90)
GFR, EST NON AFRICAN AMERICAN: 77 mL/min — AB (ref >90)
Glucose, Bld: 133 mg/dL — ABNORMAL HIGH (ref 70–99)
Glucose, Bld: 142 mg/dL — ABNORMAL HIGH (ref 70–99)
Potassium: 4.4 mmol/L (ref 3.5–5.1)
Potassium: 4.9 mmol/L (ref 3.5–5.1)
SODIUM: 132 mmol/L — AB (ref 135–145)
SODIUM: 133 mmol/L — AB (ref 135–145)

## 2014-05-23 LAB — GLUCOSE, CAPILLARY
GLUCOSE-CAPILLARY: 124 mg/dL — AB (ref 70–99)
GLUCOSE-CAPILLARY: 97 mg/dL (ref 70–99)
GLUCOSE-CAPILLARY: 113 mg/dL — AB (ref 70–99)
GLUCOSE-CAPILLARY: 115 mg/dL — AB (ref 70–99)
Glucose-Capillary: 122 mg/dL — ABNORMAL HIGH (ref 70–99)

## 2014-05-23 LAB — COMPREHENSIVE METABOLIC PANEL
ALT: 132 U/L — ABNORMAL HIGH (ref 0–35)
AST: 257 U/L — ABNORMAL HIGH (ref 0–37)
Albumin: 2.2 g/dL — ABNORMAL LOW (ref 3.5–5.2)
Alkaline Phosphatase: 74 U/L (ref 39–117)
Anion gap: 6 (ref 5–15)
BILIRUBIN TOTAL: 1.4 mg/dL — AB (ref 0.3–1.2)
BUN: 12 mg/dL (ref 6–23)
CHLORIDE: 114 meq/L — AB (ref 96–112)
CO2: 15 mmol/L — ABNORMAL LOW (ref 19–32)
CREATININE: 0.92 mg/dL (ref 0.50–1.10)
Calcium: 6.7 mg/dL — ABNORMAL LOW (ref 8.4–10.5)
GFR calc Af Amer: 79 mL/min — ABNORMAL LOW (ref >90)
GFR calc non Af Amer: 68 mL/min — ABNORMAL LOW (ref >90)
Glucose, Bld: 112 mg/dL — ABNORMAL HIGH (ref 70–99)
Potassium: 5.2 mmol/L — ABNORMAL HIGH (ref 3.5–5.1)
Sodium: 135 mmol/L (ref 135–145)
Total Protein: 4.8 g/dL — ABNORMAL LOW (ref 6.0–8.3)

## 2014-05-23 LAB — MAGNESIUM
MAGNESIUM: 1.7 mg/dL (ref 1.5–2.5)
Magnesium: 1.1 mg/dL — ABNORMAL LOW (ref 1.5–2.5)

## 2014-05-23 LAB — CBC
HCT: 35.4 % — ABNORMAL LOW (ref 36.0–46.0)
Hemoglobin: 11.8 g/dL — ABNORMAL LOW (ref 12.0–15.0)
MCH: 36 pg — ABNORMAL HIGH (ref 26.0–34.0)
MCHC: 33.3 g/dL (ref 30.0–36.0)
MCV: 107.9 fL — ABNORMAL HIGH (ref 78.0–100.0)
PLATELETS: 139 10*3/uL — AB (ref 150–400)
RBC: 3.28 MIL/uL — AB (ref 3.87–5.11)
RDW: 13.1 % (ref 11.5–15.5)
WBC: 10.3 10*3/uL (ref 4.0–10.5)

## 2014-05-23 LAB — TROPONIN I: Troponin I: 3.76 ng/mL (ref <0.031)

## 2014-05-23 LAB — LACTIC ACID, PLASMA
LACTIC ACID, VENOUS: 4.7 mmol/L — AB (ref 0.5–2.2)
Lactic Acid, Venous: 3.9 mmol/L — ABNORMAL HIGH (ref 0.5–2.2)

## 2014-05-23 LAB — PHOSPHORUS: Phosphorus: 2.3 mg/dL (ref 2.3–4.6)

## 2014-05-23 LAB — TRIGLYCERIDES: TRIGLYCERIDES: 107 mg/dL (ref <150)

## 2014-05-23 MED ORDER — NOREPINEPHRINE BITARTRATE 1 MG/ML IV SOLN
2.0000 ug/min | INTRAVENOUS | Status: DC
  Filled 2014-05-23 (×3): qty 4

## 2014-05-23 MED ORDER — MIDAZOLAM HCL 2 MG/2ML IJ SOLN: 2.0000 mg | INTRAMUSCULAR | Status: DC | PRN

## 2014-05-23 MED ORDER — VITAL HIGH PROTEIN PO LIQD
1000.0000 mL | ORAL | Status: DC
  Administered 2014-05-23: 1000 mL
  Filled 2014-05-23 (×2): qty 1000

## 2014-05-23 MED ORDER — MAGNESIUM SULFATE 2 GM/50ML IV SOLN
2.0000 g | Freq: Once | INTRAVENOUS | Status: AC
  Administered 2014-05-23: 2 g via INTRAVENOUS
  Filled 2014-05-23: qty 50

## 2014-05-23 MED ORDER — SODIUM BICARBONATE 8.4 % IV SOLN
INTRAVENOUS | Status: DC
  Administered 2014-05-23 (×2): via INTRAVENOUS
  Filled 2014-05-23 (×3): qty 150

## 2014-05-23 MED ORDER — ARTIFICIAL TEARS OP OINT
TOPICAL_OINTMENT | Freq: Three times a day (TID) | OPHTHALMIC | Status: DC
  Administered 2014-05-23: 10:00:00 via OPHTHALMIC

## 2014-05-23 MED ORDER — SODIUM CHLORIDE 0.9 % IV BOLUS (SEPSIS)
1000.0000 mL | Freq: Once | INTRAVENOUS | Status: AC
  Administered 2014-05-23: 1000 mL via INTRAVENOUS

## 2014-05-23 MED ORDER — INSULIN ASPART 100 UNIT/ML ~~LOC~~ SOLN
0.0000 [IU] | SUBCUTANEOUS | Status: DC
  Administered 2014-05-23 – 2014-05-24 (×2): 2 [IU] via SUBCUTANEOUS

## 2014-05-23 MED ORDER — ENOXAPARIN SODIUM 40 MG/0.4ML ~~LOC~~ SOLN
40.0000 mg | SUBCUTANEOUS | Status: DC
  Administered 2014-05-23 – 2014-05-24 (×2): 40 mg via SUBCUTANEOUS
  Filled 2014-05-23 (×3): qty 0.4

## 2014-05-23 MED ORDER — AMPICILLIN-SULBACTAM SODIUM 3 (2-1) G IJ SOLR
3.0000 g | Freq: Four times a day (QID) | INTRAMUSCULAR | Status: DC
  Administered 2014-05-23 – 2014-05-25 (×11): 3 g via INTRAVENOUS
  Filled 2014-05-23 (×14): qty 3

## 2014-05-23 MED ORDER — SODIUM CHLORIDE 0.9 % IV SOLN
200.0000 mg | Freq: Two times a day (BID) | INTRAVENOUS | Status: DC
  Administered 2014-05-23 – 2014-05-25 (×5): 200 mg via INTRAVENOUS
  Filled 2014-05-23 (×11): qty 20

## 2014-05-23 MED ORDER — PANTOPRAZOLE SODIUM 40 MG PO PACK
40.0000 mg | PACK | ORAL | Status: DC
  Administered 2014-05-23 – 2014-05-25 (×3): 40 mg
  Filled 2014-05-23 (×5): qty 20

## 2014-05-23 MED ORDER — PHENYLEPHRINE HCL 10 MG/ML IJ SOLN
30.0000 ug/min | INTRAVENOUS | Status: DC
  Administered 2014-05-23: 100 ug/min via INTRAVENOUS
  Administered 2014-05-23: 85 ug/min via INTRAVENOUS
  Administered 2014-05-23: 105 ug/min via INTRAVENOUS
  Administered 2014-05-24: 45 ug/min via INTRAVENOUS
  Filled 2014-05-23 (×3): qty 4

## 2014-05-23 MED ORDER — PROPOFOL 10 MG/ML IV EMUL
5.0000 ug/kg/min | INTRAVENOUS | Status: DC
  Administered 2014-05-23: 10 ug/kg/min via INTRAVENOUS
  Filled 2014-05-23: qty 100

## 2014-05-23 MED ORDER — CETYLPYRIDINIUM CHLORIDE 0.05 % MT LIQD
7.0000 mL | Freq: Four times a day (QID) | OROMUCOSAL | Status: DC
  Administered 2014-05-23 – 2014-05-26 (×11): 7 mL via OROMUCOSAL

## 2014-05-23 MED ORDER — ONDANSETRON HCL 4 MG/2ML IJ SOLN
4.0000 mg | Freq: Four times a day (QID) | INTRAMUSCULAR | Status: DC | PRN
Start: 2014-05-23 — End: 2014-05-26

## 2014-05-23 MED ORDER — PROPOFOL 10 MG/ML IV EMUL: 5.0000 ug/kg/min | INTRAVENOUS | Status: DC

## 2014-05-23 MED ORDER — SODIUM CHLORIDE 0.9 % IV SOLN
1.0000 g | Freq: Once | INTRAVENOUS | Status: AC
  Administered 2014-05-23: 1 g via INTRAVENOUS
  Filled 2014-05-23: qty 10

## 2014-05-23 MED ORDER — PROPOFOL 10 MG/ML IV EMUL
0.0000 ug/kg/min | INTRAVENOUS | Status: DC
  Administered 2014-05-23 – 2014-05-24 (×2): 16 ug/kg/min via INTRAVENOUS
  Administered 2014-05-24: 10 ug/kg/min via INTRAVENOUS
  Filled 2014-05-23 (×3): qty 100

## 2014-05-23 MED ORDER — CHLORHEXIDINE GLUCONATE 0.12 % MT SOLN
15.0000 mL | Freq: Two times a day (BID) | OROMUCOSAL | Status: DC
  Administered 2014-05-23 – 2014-05-25 (×6): 15 mL via OROMUCOSAL
  Filled 2014-05-23 (×6): qty 15

## 2014-05-23 MED ORDER — PRO-STAT SUGAR FREE PO LIQD
30.0000 mL | Freq: Every day | ORAL | Status: DC
  Administered 2014-05-23 – 2014-05-24 (×2): 30 mL
  Filled 2014-05-23 (×3): qty 30

## 2014-05-23 MED ORDER — FENTANYL CITRATE 0.05 MG/ML IJ SOLN: 100.0000 ug | INTRAMUSCULAR | Status: DC | PRN

## 2014-05-23 MED ORDER — VITAL AF 1.2 CAL PO LIQD
1000.0000 mL | ORAL | Status: DC
  Administered 2014-05-23 – 2014-05-24 (×2): 1000 mL
  Filled 2014-05-23 (×4): qty 1000

## 2014-05-23 NOTE — Progress Notes (Addendum)
CONSULT NOTE - INITIAL  Pharmacy Consult for unasyn, lovenox Indication: PNA, VTE prophylaxis  No Known Allergies  Patient Measurements: Height: 5\' 6"  (167.6 cm) Weight: 143 lb 15.4 oz (65.3 kg) IBW/kg (Calculated) : 59.3  Vital Signs: Temp: 97.5 F (36.4 C) (01/12 0700) Temp Source: Core (Comment) (01/12 0700) BP: 93/65 mmHg (01/12 0838) Pulse Rate: 108 (01/12 0838) Intake/Output from previous day: 01/11 0701 - 01/12 0700 In: 5106.8 [I.V.:4556.8; IV Piggyback:550] Out: 282 [Urine:282] Intake/Output from this shift:    Labs:  Recent Labs  06/04/2014 0604  05/22/14 0400  05/22/14 1206 05/22/14 1635 05/22/14 2000 05/23/14 0001 05/23/14 0400  WBC 9.5  --  12.1*  --   --   --   --   --  10.3  HGB 12.7  < > 12.1  < > 13.6 13.6  --   --  11.8*  PLT 105*  --  100*  --   --   --   --   --  139*  CREATININE 1.28*  < > 0.76  < > 0.60 0.60 0.82 0.87 0.92  < > = values in this interval not displayed. Estimated Creatinine Clearance: 63.2 mL/min (by C-G formula based on Cr of 0.92). No results for input(s): VANCOTROUGH, VANCOPEAK, VANCORANDOM, GENTTROUGH, GENTPEAK, GENTRANDOM, TOBRATROUGH, TOBRAPEAK, TOBRARND, AMIKACINPEAK, AMIKACINTROU, AMIKACIN in the last 72 hours.   Microbiology: Recent Results (from the past 720 hour(s))  MRSA PCR Screening     Status: None   Collection Time: 06/01/2014 11:30 AM  Result Value Ref Range Status   MRSA by PCR NEGATIVE NEGATIVE Final    Comment:        The GeneXpert MRSA Assay (FDA approved for NASAL specimens only), is one component of a comprehensive MRSA colonization surveillance program. It is not intended to diagnose MRSA infection nor to guide or monitor treatment for MRSA infections.     Medical History: History reviewed. No pertinent past medical history.  Medications:  Scheduled:  . ampicillin-sulbactam (UNASYN) IV  3 g Intravenous Q6H  . antiseptic oral rinse  7 mL Mouth Rinse QID  . artificial tears   Both Eyes 3 times  per day  . chlorhexidine  15 mL Mouth Rinse BID  . enoxaparin (LOVENOX) injection  40 mg Subcutaneous Q24H  . feeding supplement (VITAL HIGH PROTEIN)  1,000 mL Per Tube Q24H  . insulin aspart  0-15 Units Subcutaneous 6 times per day  . lacosamide (VIMPAT) IV  100 mg Intravenous Q12H  . levETIRAcetam  1,500 mg Intravenous Q12H  . magnesium sulfate 1 - 4 g bolus IVPB  2 g Intravenous Once  . pantoprazole sodium  40 mg Per Tube Q24H   Assessment: 59 yo female s/p cardiac arrest and hypothermia protocol to begin unasyn for possible aspiration PNA.  WBC= 10.3, afeb, SCr= 0.92 and CrCl ~ 60. Pharmacy was also asked to dose lovenox for VTE prophylaxis (heparin sqgiven this am)  1/12 unasyn>> 1/11 blood x2 1/12 resp   Plan:  -Unasyn 3gm IV q6h -Enoxaparin 40mg  sq daily -Will follow renal function, cultures and clinical progress  Harland Germanndrew Keiandra Sullenger, Pharm D 05/23/2014 9:40 AM

## 2014-05-23 NOTE — Progress Notes (Signed)
Patient Name: [name] Solis Date of Encounter: 05/23/2014     Active Problems:   Cardiac arrest   Acute respiratory failure with hypoxia   Encephalopathy acute   Anoxic brain injury   Status epilepticus    SUBJECTIVE  59 year-old woman with no past medical history who collapsed at home with a witnessed cardiac arrest. CPR was immediately started and on EMS arrival she was in VF as initial rhythm. Reportedly received multiple shocks with return of sinus rhythm and spontaneous circulation. Seen by CCM in the Emergency Dept and noted to have diffuse ST depression on EKG She underwent cardiac catheterization by Dr. Excell Seltzer on 05/15/2014.  Emergent cardiac catheterization showed severe LV systolic dysfunction with an ejection fraction estimated at 20%.  There was minimal nonobstructive coronary disease.  It was felt that her cardiac arrest was likely secondary to severe hypokalemia. Last evening the patient developed wide complex tachycardia felt to be sinus tachycardia with left bundle branch block. She continues to be hypotensive requiring multiple pressors.  CURRENT MEDS . ampicillin-sulbactam (UNASYN) IV  3 g Intravenous Q6H  . antiseptic oral rinse  7 mL Mouth Rinse QID  . artificial tears   Both Eyes 3 times per day  . chlorhexidine  15 mL Mouth Rinse BID  . enoxaparin (LOVENOX) injection  40 mg Subcutaneous Q24H  . feeding supplement (VITAL HIGH PROTEIN)  1,000 mL Per Tube Q24H  . insulin aspart  0-15 Units Subcutaneous 6 times per day  . lacosamide (VIMPAT) IV  100 mg Intravenous Q12H  . levETIRAcetam  1,500 mg Intravenous Q12H  . magnesium sulfate 1 - 4 g bolus IVPB  2 g Intravenous Once  . pantoprazole sodium  40 mg Per Tube Q24H    OBJECTIVE  Filed Vitals:   05/23/14 0700 05/23/14 0800 05/23/14 0838 05/23/14 0900  BP:   93/65   Pulse:   108   Temp: 97.5 F (36.4 C) 96.8 F (36 C)  97.9 F (36.6 C)  TempSrc: Core (Comment)   Core (Comment)  Resp: Height:       Weight:  143 lb 15.4 oz (65.3 kg)    SpO2: 100%  100%     Intake/Output Summary (Last 24 hours) at 05/23/14 0959 Last data filed at 05/23/14 0700  Gross per 24 hour  Intake 4811.85 ml  Output    257 ml  Net 4554.85 ml   Filed Weights   05/25/2014 0947 06/04/2014 1100 05/23/14 0800  Weight: 122 lb 8.9 oz (55.59 kg) 121 lb 4.1 oz (55 kg) 143 lb 15.4 oz (65.3 kg)    PHYSICAL EXAM  General: Intubated.  Sedated on vent. HEENT:  Normal  Neck: Supple without bruits or JVD. Lungs:  Resp regular and unlabored, CTA. Heart: RRR no s3, s4, or murmurs. Abdomen: Soft, non-tender, non-distended, BS + x 4.  Extremities: No clubbing, cyanosis or edema.   Accessory Clinical Findings  CBC  Recent Labs  06/11/2014 0604  05/22/14 0400  05/22/14 1635 05/23/14 0400  WBC 9.5  --  12.1*  --   --  10.3  NEUTROABS 4.3  --   --   --   --   --   HGB 12.7  < > 12.1  < > 13.6 11.8*  HCT 36.4  < > 35.2*  < > 40.0 35.4*  MCV 108.0*  --  103.8*  --   --  107.9*  PLT 105*  --  100*  --   --  139*  < > = values in this interval not displayed. Basic Metabolic Panel  Recent Labs  05/23/14 0001 05/23/14 0400  NA 133* 135  K 4.9 5.2*  CL 111 114*  CO2 14* 15*  GLUCOSE 142* 112*  BUN 11 12  CREATININE 0.87 0.92  CALCIUM 7.1* 6.7*  MG  --  1.1*   Liver Function Tests  Recent Labs  05/22/2014 0604 05/23/14 0400  AST 159* 257*  ALT 71* 132*  ALKPHOS 93 74  BILITOT 1.7* 1.4*  PROT 5.0* 4.8*  ALBUMIN 2.4* 2.2*   No results for input(s): LIPASE, AMYLASE in the last 72 hours. Cardiac Enzymes  Recent Labs  05/28/2014 1914 05/22/14 0025 05/23/14 0400  TROPONINI 5.26* 4.67* 3.76*   BNP Invalid input(s): POCBNP D-Dimer No results for input(s): DDIMER in the last 72 hours. Hemoglobin A1C No results for input(s): HGBA1C in the last 72 hours. Fasting Lipid Panel  Recent Labs  05/23/14 0822  TRIG 107   Thyroid Function Tests No results for input(s): TSH, T4TOTAL, T3FREE, THYROIDAB  in the last 72 hours.  Invalid input(s): FREET3  TELE  Regular tachycardia at 10 7/m suggests sinus tachycardia with left bundle branch block  ECG  Wide-complex tachycardia since prior tracings of 05/18/2014  Radiology/Studies  Ct Head Wo Contrast  05/13/2014   CLINICAL DATA:  Acute cardiac arrest earlier in the day  EXAM: CT HEAD WITHOUT CONTRAST  TECHNIQUE: Contiguous axial images were obtained from the base of the skull through the vertex without intravenous contrast.  COMPARISON:  None.  FINDINGS: The ventricles are normal in size and configuration. There is, however, mild frontal atrophy bilaterally. There is no intracranial mass, hemorrhage, extra-axial fluid collection, or midline shift. There remains gray -white differentiation without appreciable edema by CT. No focal gray-white compartment lesions are identified. No acute infarct is apparent. The bony calvarium appears intact. The mastoid air cells are clear. There is diffuse opacification of the visualize left maxillary antrum. There is rightward deviation of the nasal septum. There is opacification of several ethmoid air cells on the left. Patient is intubated.  IMPRESSION: Mild frontal atrophy bilaterally. No intracranial edema is appreciable. There remains gray-white compartment differentiation. No intracranial mass, hemorrhage, or extra-axial fluid. No acute infarct apparent. Paranasal sinus disease. Rightward deviation nasal septum.   Electronically Signed   By: Bretta BangWilliam  Woodruff M.D.   On: 05/28/2014 08:41   Dg Chest Port 1 View  05/23/2014   CLINICAL DATA:  Respiratory failure.  EXAM: PORTABLE CHEST - 1 VIEW  COMPARISON:  06/04/2014.  FINDINGS: Endotracheal tube, NG tube, left central line in stable position. Mediastinum and hilar structures are stable. Heart size is stable. However there is interim development of pulmonary venous congestion and bilateral interstitial prominence with small pleural effusions. These findings suggest  congestive heart failure. Superimposed pneumonia cannot be excluded . No pneumothorax.  IMPRESSION: 1. Lines and tubes in stable position. 2. Interim development of pulmonary venous congestion, bilateral interstitial infiltrates, and small pleural effusions. These findings suggest congestive heart failure. Superimposed pneumonia cannot be excluded.   Electronically Signed   By: Maisie Fushomas  Register   On: 05/23/2014 07:14   Dg Chest Portable 1 View  05/12/2014   CLINICAL DATA:  Hypoxia.  Status post cardiac arrest  EXAM: PORTABLE CHEST - 1 VIEW  COMPARISON:  Study obtained earlier in the day  FINDINGS: Endotracheal tube tip is 4.1 cm above the carina. Nasogastric tube tip and side port are below the diaphragm. Central catheter  tip is in the superior vena cava. No pneumothorax. Lungs are clear. Heart size and pulmonary vascularity are normal. No adenopathy.  IMPRESSION: Tube and catheter positions as described without pneumothorax. No edema or consolidation.   Electronically Signed   By: Bretta Bang M.D.   On: 06/03/2014 08:09   Dg Chest Port 1 View  05/16/2014   CLINICAL DATA:  Code STEMI.  Intubated.  NG tube placed.  EXAM: PORTABLE CHEST - 1 VIEW  COMPARISON:  None.  FINDINGS: Endotracheal tube tip measures 4.5 cm above the carinal. Enteric tube tip is off the field of view but below the left hemidiaphragm. Normal heart size and pulmonary vascularity. Mediastinal contours appear intact. Lungs appear clear and expanded.  IMPRESSION: Appliances appear in satisfactory location. No evidence of active pulmonary disease.   Electronically Signed   By: Burman Nieves M.D.   On: 05/20/2014 06:52    ASSESSMENT AND PLAN 1.  Status post VF arrest 2.  Wide complex tachycardia appears to be sinus tachycardia with left bundle branch block. 3.  Ongoing hypotension requiring Levophed, phenylephrine, and vasopressin at the present time  Plan: Continue supportive care.  Her echocardiogram should be done later today  and may help [place] to identify P waves and atrial activity.   Signed, Cassell Clement MD

## 2014-05-23 NOTE — Progress Notes (Signed)
Cardiology Progress Note Asked for assistance regarding patient's deteriorating cardiovascular status. Currently requiring levophed, vasopressin, and phenylephrine to maintain blood pressures. Currently using ketamine to suppress epileptic activity. Cath at presentation reported no significant Overnight, patient has developed a wide complex tachycardia at ~130 bpm.  12 lead demonstrates LBBB morphology. P waves difficult to discern but appear to be buried in preceding TW. Telemetry demonstrates a slowly progressing tachycardia that began narrow and subsequently widened. Though a little atypical for a rate related bundle (usually this is RBBB), this does not appear to be a ventricular tachycardia. Other contributing factors that can cause widened QRS include electrolyte and pH abnormalities and ischemia. I am not familiar with ketamine causing this though nursing indicates a temporal relationship. I have asked that an echo be done to assist with further management (may help delineate longstanding cardiomyopathy vs. Acute stunned myocardium).

## 2014-05-23 NOTE — Progress Notes (Signed)
EEG maint done. No skin breakdown at electrode site Fp1 Fp2 ground and EKG. Pt is currently running LTM

## 2014-05-23 NOTE — Progress Notes (Signed)
[facility] PCCM PROGRESS NOTE  S: Called to bedside by staff RN re: new onset tachycardia. Patient had recently been started on ketamine for status. Also has been on high doses of vasopressors including levophed. Tachycardia has been slowly progressive over the course of the evening.    O: BP 80/62 mmHg  Pulse 105  Temp(Src) 98.8 F (37.1 C) (Core (Comment))  Resp 14  Ht 5\' 6"  (1.676 m)  Wt 55 kg (121 lb 4.1 oz)  BMI 19.58 kg/m2  SpO2 100%  Neuro:  Deeply sedated HEENT:  [place]/AT, no JVD Neck:  Supple, no JVD noted Cardiovascular:  Tachy, regular Lungs:  No wheeze Abdomen:  Soft, non-tender Musculoskeletal:   no acute deformity Skin:  Intact, MMM   A/P: New onset wide complex tachycardia Refractory shock Persistent metabolic acidosis (worsening) Status epilepticus  Discussion: New onset tachycardia. Wide complex in nature with new onset LBBB. Cardiology has been called to evaluate EKG changed. Neurology has also been contacted and commented on potential of ketamine to cause tachycardia and arrhythmia.   Plan: Start propofol for seizure control and wean ketamine to off Wean levophed as able and increase phenylephrine to maintain MAP goal Increase MV on vent Echo Cardiology to evaluate EKG and telemetry.  Joneen RoachPaul Loras Grieshop, ACNP [facility] Pager [phone] or [phone]

## 2014-05-23 NOTE — Progress Notes (Signed)
Subjective: Patient remains unresponsive, intubated and on mechanical ventilation. Status epilepticus was finally controlled this morning primarily with propofol. Ketamine was discontinued. Patient has remained burst suppression pattern. No overnight adverse neurologic events reported otherwise.   Objective: Current vital signs: BP 93/65 mmHg  Pulse 108  Temp(Src) 97.5 F (36.4 C) (Core (Comment))  Resp 24  Ht 5\' 6"  (1.676 m)  Wt 65.3 kg (143 lb 15.4 oz)  BMI 23.25 kg/m2  SpO2 100%  Neurologic Exam: Patient was unresponsive to noxious stimuli. No spontaneous respirations were noted. Pupils were 2-3 mm and did not react to light. Extraocular movements were absent with oculocephalic maneuvers. Corneal reflexes were absent. No facial asymmetry was noted. Muscle tone was flaccid throughout. Patient had no spontaneous movements and no abnormal posturing. Deep tendon reflexes were 2+ and symmetrical. Plantar responses were mute bilaterally.  Medications: I have reviewed the patient's current medications.  Assessment/Plan: 59 year old lady with anoxic encephalopathy associated with cardiac arrest on 05/21/2013, likely severe. Status epilepticus was controlled at this point with burst suppression pattern induced with propofol. Patient is also on Keppra and Vimpat.   Plan: 1. Continue EEG monitoring overnight with plans to slowly wean propofol. 2. Obtain CT scan of the brain tomorrow to rule out ischemic changes as well as cerebral edema. 3. Continue Keppra and Vimpat at current doses.  This patient is critically ill and at significant risk of neurological worsening, death and care requires constant monitoring of vital signs, hemodynamics,respiratory and cardiac monitoring, neurological assessment, discussion with family, other specialists and medical decision making of high complexity. Total critical care time was 35 minutes.  C.R. Roseanne RenoStewart, MD [facility] [phone]  05/23/2014  9:30 AM

## 2014-05-23 NOTE — Progress Notes (Signed)
CRITICAL VALUE ALERT  Critical value received:  Calcium 6.1  Date of notification:  05/23/14  Time of notification:  1725  Critical value read back:Yes.    Nurse who received alert:  Carlton AdamSarah herbert  MD notified (1st page):  Dr. Vassie Lollalva  Time of first page:  1725  MD notified (2nd page):  Time of second page:  Responding MD: alva  Time MD responded:  1725

## 2014-05-23 NOTE — Progress Notes (Addendum)
PULMONARY / CRITICAL CARE MEDICINE HISTORY AND PHYSICAL EXAMINATION   Name: [name] Solis MRN: [id number] DOB: 02-08-1956    ADMISSION DATE:  07-13-14  PRIMARY SERVICE: PCCM  CHIEF COMPLAINT:  Vfib arrest  BRIEF PATIENT DESCRIPTION:  59 yo female with VF cardiac arrest with 10 minutes before ROSC.  SIGNIFICANT EVENTS:  1/10 Admitted, Vfib arrest, EKG with ST depressions, Hypothermia protocol started, cardiology consulted 1/11 Rewarming; Status epilepticus >> neurology consulted 1/12 Wide complex tachyardia  STUDIES: 1/10 CT head >> mild frontal atrophy b/l, Rt nasal septal deviation 1/10 Lt heart cath >> minimal, non obstructive CAD  SUBJECTIVE:  Remains on multiple pressors, diprivan coma.  VITAL SIGNS: Temp:  [87.4 F (30.8 C)-99.1 F (37.3 C)] 97.5 F (36.4 C) (01/12 0700) Pulse Rate:  [60-113] 105 (01/12 0000) Resp:  [14-20] 20 (01/12 0600) BP: (79-110)/(41-77) 86/41 mmHg (01/12 0400) SpO2:  [100 %] 100 % (01/12 0700) FiO2 (%):  [40 %] 40 % (01/12 0700) HEMODYNAMICS: CVP:  [8 mmHg-17 mmHg] 17 mmHg VENTILATOR SETTINGS: Vent Mode:  [-] PRVC FiO2 (%):  [40 %] 40 % Set Rate:  [14 bmp] 14 bmp Vt Set:  [480 mL] 480 mL PEEP:  [5 cmH20] 5 cmH20 Plateau Pressure:  [15 cmH20-16 cmH20] 16 cmH20 INTAKE / OUTPUT: Intake/Output      01/11 0701 - 01/12 0700 01/12 0701 - 01/13 0700   I.V. (mL/kg) 3997.3 (72.7)    NG/GT     IV Piggyback 550    Total Intake(mL/kg) 4547.3 (82.7)    Urine (mL/kg/hr) 232 (0.2)    Total Output 232     Net +4315.3            PHYSICAL EXAMINATION: General: no distress Neuro: RASS -5 HEENT:  Pupils reactive Cardiovascular: regular Lungs: scattered rhonchi Abdomen: soft Skin:  No rashes MSK: 1+ edema  LABS:  CBC  Recent Labs Lab Sep 05, 2014 0604  05/22/14 0400  05/22/14 1206 05/22/14 1635 05/23/14 0400  WBC 9.5  --  12.1*  --   --   --  10.3  HGB 12.7  < > 12.1  < > 13.6 13.6 11.8*  HCT 36.4  < > 35.2*  < > 40.0 40.0 35.4*   PLT 105*  --  100*  --   --   --  139*  < > = values in this interval not displayed.   Coag's  Recent Labs Lab Sep 05, 2014 0604 Sep 05, 2014 1015 Sep 05, 2014 1514 Sep 05, 2014 2245  APTT 34 45* 28 28  INR 1.23  --   --  0.98   BMET  Recent Labs Lab 05/22/14 2000 05/23/14 0001 05/23/14 0400  NA 138 133* 135  K 5.1 4.9 5.2*  CL 113* 111 114*  CO2 16* 14* 15*  BUN 12 11 12   CREATININE 0.82 0.87 0.92  GLUCOSE 95 142* 112*   Electrolytes  Recent Labs Lab 05/22/14 2000 05/23/14 0001 05/23/14 0400  CALCIUM 7.4* 7.1* 6.7*  MG  --   --  1.1*   Sepsis Markers  Recent Labs Lab Sep 05, 2014 0611 05/23/14 0530  LATICACIDVEN 9.93* 3.9*   ABG  Recent Labs Lab Sep 05, 2014 0627 05/22/14 0345 05/23/14 0411  PHART 7.274* 7.322* 7.243*  PCO2ART 49.8* 29.1* 34.0*  PO2ART 505.0* 133.0* 148.0*   Liver Enzymes  Recent Labs Lab Sep 05, 2014 0604 05/23/14 0400  AST 159* 257*  ALT 71* 132*  ALKPHOS 93 74  BILITOT 1.7* 1.4*  ALBUMIN 2.4* 2.2*   Cardiac Enzymes  Recent Labs Lab Sep 05, 2014 1914 05/22/14 0025  05/23/14 0400  TROPONINI 5.26* 4.67* 3.76*   Glucose  Recent Labs Lab 05/22/14 0835 05/22/14 1207 05/22/14 1630 05/22/14 1956 05/22/14 2356 05/23/14 0403  GLUCAP 130* 153* 86 90 130* 122*    Imaging Ct Head Wo Contrast  05/15/2014   CLINICAL DATA:  Acute cardiac arrest earlier in the day  EXAM: CT HEAD WITHOUT CONTRAST  TECHNIQUE: Contiguous axial images were obtained from the base of the skull through the vertex without intravenous contrast.  COMPARISON:  None.  FINDINGS: The ventricles are normal in size and configuration. There is, however, mild frontal atrophy bilaterally. There is no intracranial mass, hemorrhage, extra-axial fluid collection, or midline shift. There remains gray -white differentiation without appreciable edema by CT. No focal gray-white compartment lesions are identified. No acute infarct is apparent. The bony calvarium appears intact. The mastoid air  cells are clear. There is diffuse opacification of the visualize left maxillary antrum. There is rightward deviation of the nasal septum. There is opacification of several ethmoid air cells on the left. Patient is intubated.  IMPRESSION: Mild frontal atrophy bilaterally. No intracranial edema is appreciable. There remains gray-white compartment differentiation. No intracranial mass, hemorrhage, or extra-axial fluid. No acute infarct apparent. Paranasal sinus disease. Rightward deviation nasal septum.   Electronically Signed   By: Bretta Bang M.D.   On: 05/12/2014 08:41   Dg Chest Port 1 View  05/23/2014   CLINICAL DATA:  Respiratory failure.  EXAM: PORTABLE CHEST - 1 VIEW  COMPARISON:  05/20/2014.  FINDINGS: Endotracheal tube, NG tube, left central line in stable position. Mediastinum and hilar structures are stable. Heart size is stable. However there is interim development of pulmonary venous congestion and bilateral interstitial prominence with small pleural effusions. These findings suggest congestive heart failure. Superimposed pneumonia cannot be excluded . No pneumothorax.  IMPRESSION: 1. Lines and tubes in stable position. 2. Interim development of pulmonary venous congestion, bilateral interstitial infiltrates, and small pleural effusions. These findings suggest congestive heart failure. Superimposed pneumonia cannot be excluded.   Electronically Signed   By: Maisie Fus  Register   On: 05/23/2014 07:14   Dg Chest Portable 1 View  05/15/2014   CLINICAL DATA:  Hypoxia.  Status post cardiac arrest  EXAM: PORTABLE CHEST - 1 VIEW  COMPARISON:  Study obtained earlier in the day  FINDINGS: Endotracheal tube tip is 4.1 cm above the carina. Nasogastric tube tip and side port are below the diaphragm. Central catheter tip is in the superior vena cava. No pneumothorax. Lungs are clear. Heart size and pulmonary vascularity are normal. No adenopathy.  IMPRESSION: Tube and catheter positions as described without  pneumothorax. No edema or consolidation.   Electronically Signed   By: Bretta Bang M.D.   On: 05/20/2014 08:09   ASSESSMENT / PLAN:  PULMONARY ETT 1/10 >> A: Acute respiratory failure 2nd to cardiac arrest. P:   Full vent support Increase RR to 24 F/u CXR, ABG  CARDIOVASCULAR A: VF arrest with cardiogenic shock >> no significant CAD on cath. Wide complex tachycardia 1/12. Shock >> cardiogenic, meds, and ? Sepsis. P:   Monitor hemodynamics Continue pressors while on hypothermia Goal CVP > 10 Pressors to keep SBP > 90, MAP > 65 F/u lactic acid, cortisol  RENAL A: AKI 2nd to cardiogenic shock >> resolved. Gap/non gap metabolic acidosis. Hypokalemia >> resolved. Hyperkalemia. Hypomagnesemia. P:   Add HCO3 to IV fluid F/u BMET, ABG Monitor renal fx, urine outpt  GASTROINTESTINAL A: Shock liver. Nutrition. P:   F/u LFT Protonix  for SUP Tube feeds  HEMATOLOGIC A: Thrombocytopenia. P:   Follow CBC SCD, Lovenox for DVT prevention  INFECTIOUS A: Possible aspiration PNA. P:   Day 1 unasyn  Blood 1/11 >> Sputum 1/11 >>  ENDOCRINE A: Hyperglycemia. P:   SSI  NEUROLOGIC A: Acute encephalopathy 2nd to cardiac arrest with concern for anoxic encephalopathy. Status epilepticus. P:   Diprivan coma RASS goal -5 until seizures controlled Continuous EEG per neurology Vimpat, Keppra per neurology  No family at bedside 1/12.  Family conference due 1/17:  CC time 45 minutes.  Coralyn Helling, MD [facility] Pulmonary/Critical Care 05/23/2014, 7:53 AM Pager:  [phone] After 3pm call: [phone]

## 2014-05-23 NOTE — Procedures (Addendum)
Electroencephalogram report- LTM   Ordering Physician : Dr. Roseanne RenoStewart  EEG number: [id number]  Data acquisition: 10-20 electrode placement.  Additional T1, T2, and EKG electrodes; 26 channel digital referential acquisition reformatted to 18 channel/7 channel coronal bipolar     Spike detection: ON     Seizure detection: ON   Beginning time: 05/22/14 Ending time: 05/23/14  Day of study: Day 1   This 24 hours of intensive EEG monitoring with simultaneous video monitoring was performed for this 59 yo  patient with cerebral anoxia status post cardiac arrest.  Patient is intubated and sedated and had underwent cooling  protocol Medications: Versed , ketamine and later  propofol  There was no pushbutton activations events during this recording.  As recording  begins background activity are marked  by a burst and suppression pattern.  Bursts tend to last approximately 2 seconds and consisted of mixed frequencies admixed generalized epileptiform discharges.  Bursts of cerebral activity is alternating with 2 seconds of suppressed background activities.  Intervals of suppressed background activities decrease in  duration gradually around 12 pm involving into  More continuous   background activities with generalized periodic epileptiform discharges present as a second .    This pattern continues until approximately 7 pm when  background activities consisted again  burst suppression pattern where  suppressed background activities gradually increase in duration  And   At this recording the suppressed intervals tend to last 10-15 seconds.   Bursts of cerebral activities consist of mixed frequencies with superimposed broadly distributed sharp waves at times appeared to be lateralizing to the right hemisphere particularly the right fronto central region.  In discussion with ordering physician and ICU staff,  aforementioned  change in background activity correlated with discontinuing  cooling  protocol and  subsequent increase in sedation.    Clinical interpretation: This 24 hours of intensive EEG monitoring with simultaneous video monitoring is abnormal and demonstrated findings consistent with severe encephalopathy likely related to cerebrovascular anoxia given patient's history and sedation itself.  Please see above  discussion of EEG evolution .  His findings were discussed with ordering team  at the time of interpretation.    Beginning time: 05/23/14 Ending time: 05/24/14  Day of study: Day 2  This 24 hours of intensive EEG monitoring with simultaneous video monitoring was performed for this 59 yo woman with cerebral anoxia status post cardiac arrest.  Patient is intubated and sedated with  Propofol.   There was no pushbutton activations events during this recording.  As recording  begins background activity are marked  by a burst and suppression pattern.  Bursts last 1-3  Seconds,  consisted of mixed frequencies with occasionally admixed generalized epileptiform discharges alternating with 2-4 seconds of suppressed background.   Isolated generalized spike/wave and sharp waves were also present independently of bursts of cerebral activities. The frequency of generalized epileptiform  discharges tend to increase during last several hours of the recording.   Clinical interpretation: This 24 hours of intensive EEG monitoring with simultaneous video monitoring is abnormal and consistent with burst-suppression pattern suggestive of severe encephalopathy likely related to cerebrovascular anoxia given patient's history and sedation itself.  Superimposed generalized epileptiform discharges  are suggestive of cortical irritability.

## 2014-05-23 NOTE — Progress Notes (Signed)
INITIAL NUTRITION ASSESSMENT  DOCUMENTATION CODES Per approved criteria  -Not Applicable   INTERVENTION: Initiate Vital AF 1.2 @ 25 ml/hr via NG tube and increase by 10 ml every 4 hours to goal rate of 45 ml/hr.   30 ml Prostat daily.    Tube feeding regimen provides 1396 kcal, 96 grams of protein, and 875 ml of H2O.   TF regimen and propofol at current rate providing 1535 total kcal/day (102 % of kcal needs)   NUTRITION DIAGNOSIS: Inadequate oral intake related to inability to eat as evidenced by NPO status  Goal: Pt to meet >/= 90% of their estimated nutrition needs   Monitor:  Vent status, TF initiation and tolerance, weight trends, labs   Reason for Assessment: Consult received to initiate and manage enteral nutrition support.  59 y.o. female  Admitting Dx: Cardiac Arrest  ASSESSMENT: Pt had cardiac arrest at home, admitted placed on Arctic sun protocol. Pt has now been rewarmed.  Per H&P pt had a recent GI illness with diarrhea, N/V and poor oral intake over the last week. Pt with NG tube in place, no xray confirmation.   Spoke with sister at bedside. Per sister pt had dental work (bone grafting) done in Oct/Nov and was not able to eat well, had been drinking ensure and boost. Pt developed a cold at Thanksgiving and most recently with a GI illness about a week ago.  Pt has lost weight per family, they feel she has been 115 lb recently and that her usual weight is 130 lb. Pt with 11% weight loss in the last 3 1/2 months. Unable to determine intake during that time frame.  When more history available pt may meet criteria for malnutrition. Electrolytes being monitored.   Patient is currently intubated on ventilator support MV: 12 L/min Temp (24hrs), Avg:96.7 F (35.9 C), Min:93.2 F (34 C), Max:99.1 F (37.3 C)  Propofol: 5.3 ml/hr provides: 139 kcal per day from lipid  Elevated Potassium Magnesium low  Nutrition Focused Physical Exam:  Subcutaneous Fat:   Orbital Region: WDL Upper Arm Region: WDL Thoracic and Lumbar Region: unable to assess  Muscle:  Temple Region: mild/moderate depletion Clavicle Bone Region: WDl Clavicle and Acromion Bone Region: WDL Scapular Bone Region: unable to assess Dorsal Hand: WDL Patellar Region: WDL Anterior Thigh Region: unable to assess Posterior Calf Region: WDL  Edema: not present   Height: Ht Readings from Last 1 Encounters:  05/16/2014  (1.676 m)    Weight: Wt Readings from Last 1 Encounters:  05/23/14 143 lb 15.4 oz (65.3 kg)  Admission weight: 115 lb (52.2 kg) PTA  Ideal Body Weight: 59 kg   % Ideal Body Weight: 89%  Wt Readings from Last 10 Encounters:  05/23/14 143 lb 15.4 oz (65.3 kg)    Usual Body Weight: 130 lb   % Usual Body Weight: 88%  BMI:  Body mass index is 23.25 kg/(m^2).  Estimated Nutritional Needs: Kcal: 1503 Protein: 80-100 grams Fluid: > 1.5 L/day  Skin: WDL  Diet Order: Diet NPO time specified  EDUCATION NEEDS: -No education needs identified at this time   Intake/Output Summary (Last 24 hours) at 05/23/14 1316 Last data filed at 05/23/14 1100  Gross per 24 hour  Intake 6268.98 ml  Output    277 ml  Net 5991.98 ml    Last BM: PTA   Labs:   Recent Labs Lab 05/22/14 2000 05/23/14 0001 05/23/14 0400  NA 138 133* 135  K 5.1 4.9 5.2*  CL 113* 111 114*  CO2 16* 14* 15*  BUN 12 11 12   CREATININE 0.82 0.87 0.92  CALCIUM 7.4* 7.1* 6.7*  MG  --   --  1.1*  GLUCOSE 95 142* 112*    CBG (last 3)   Recent Labs  05/23/14 0403 05/23/14 0845 05/23/14 1115  GLUCAP 122* 124* 97    Scheduled Meds: . ampicillin-sulbactam (UNASYN) IV  3 g Intravenous Q6H  . antiseptic oral rinse  7 mL Mouth Rinse QID  . chlorhexidine  15 mL Mouth Rinse BID  . enoxaparin (LOVENOX) injection  40 mg Subcutaneous Q24H  . feeding supplement (VITAL HIGH PROTEIN)  1,000 mL Per Tube Q24H  . insulin aspart  0-15 Units Subcutaneous 6 times per day  .  lacosamide (VIMPAT) IV  100 mg Intravenous Q12H  . levETIRAcetam  1,500 mg Intravenous Q12H  . pantoprazole sodium  40 mg Per Tube Q24H    Continuous Infusions: . sodium chloride 10 mL/hr at 12/26/14 0945  . fentaNYL infusion INTRAVENOUS 10 mcg/hr (12/26/14 0945)  . midazolam (VERSED) infusion 30 mg/hr (05/23/14 0034)  . norepinephrine (LEVOPHED) Adult infusion    . phenylephrine (NEO-SYNEPHRINE) Adult infusion 100 mcg/min (05/23/14 0800)  . propofol    .  sodium bicarbonate  infusion 1000 mL 75 mL/hr at 05/23/14 0930  . vasopressin (PITRESSIN) infusion - *FOR SHOCK* 0.03 Units/min (05/22/14 1624)    History reviewed. No pertinent past medical history.  Past Surgical History  Procedure Laterality Date  . Left heart cath N/A 06-07-2014    Procedure: LEFT HEART CATH;  Surgeon: Micheline ChapmanMichael D Cooper, MD;  [facility];  Service: Cardiovascular;  Laterality: N/A;    Kendell BaneHeather Raymie Giammarco RD, LDN, CNSC [phone] Pager [phone] After Hours Pager

## 2014-05-23 NOTE — Progress Notes (Signed)
eLink Physician-Brief Progress Note Patient Name: [name] Solis DOB: October 11, 1955 MRN: [id number]   Date of Service  05/23/2014  HPI/Events of Note    eICU Interventions  Hypocalcemia /hypomag-repleted      Intervention Category Intermediate Interventions: Electrolyte abnormality - evaluation and management  Shaelin Lalley V. 05/23/2014, 5:28 PM

## 2014-05-23 NOTE — Progress Notes (Signed)
Chaplain's presence was requested by RN Augusto GambleJody who said family had gotten bad news. [facility] but has experienced seizures while being "warmed." Outlook is poor. Pt's husband wanted to talk about the situation, and chaplain offered emotional support. Pt's husband is maintaining composure now but says he knows he will be grieving during the night. He plans to stay [facility] overnight. Pt's husband asked me to arrange for a Catholic priest to come for the rite of anointing. 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 I said I would arrange for a priest to come. Had prayer with pt's husband and pt's daughter. They expressed appreciation for my support.

## 2014-05-24 ENCOUNTER — Inpatient Hospital Stay (HOSPITAL_COMMUNITY): Payer: No Typology Code available for payment source

## 2014-05-24 DIAGNOSIS — J9600 Acute respiratory failure, unspecified whether with hypoxia or hypercapnia: Secondary | ICD-10-CM

## 2014-05-24 LAB — GLUCOSE, CAPILLARY
GLUCOSE-CAPILLARY: 111 mg/dL — AB (ref 70–99)
GLUCOSE-CAPILLARY: 104 mg/dL — AB (ref 70–99)
GLUCOSE-CAPILLARY: 112 mg/dL — AB (ref 70–99)
GLUCOSE-CAPILLARY: 94 mg/dL (ref 70–99)
Glucose-Capillary: 126 mg/dL — ABNORMAL HIGH (ref 70–99)
Glucose-Capillary: 96 mg/dL (ref 70–99)

## 2014-05-24 LAB — BASIC METABOLIC PANEL
ANION GAP: 10 (ref 5–15)
BUN: 16 mg/dL (ref 6–23)
CALCIUM: 6.8 mg/dL — AB (ref 8.4–10.5)
CO2: 21 mmol/L (ref 19–32)
CREATININE: 0.77 mg/dL (ref 0.50–1.10)
Chloride: 103 mEq/L (ref 96–112)
GFR calc Af Amer: >90 mL/min (ref >90)
GFR calc non Af Amer: >90 mL/min (ref >90)
GLUCOSE: 106 mg/dL — AB (ref 70–99)
Potassium: 3 mmol/L — ABNORMAL LOW (ref 3.5–5.1)
SODIUM: 134 mmol/L — AB (ref 135–145)

## 2014-05-24 LAB — COMPREHENSIVE METABOLIC PANEL
ALBUMIN: 1.6 g/dL — AB (ref 3.5–5.2)
ALK PHOS: 67 U/L (ref 39–117)
ALT: 120 U/L — AB (ref 0–35)
AST: 235 U/L — AB (ref 0–37)
Anion gap: 7 (ref 5–15)
BILIRUBIN TOTAL: 1.1 mg/dL (ref 0.3–1.2)
BUN: 9 mg/dL (ref 6–23)
CHLORIDE: 103 meq/L (ref 96–112)
CO2: 21 mmol/L (ref 19–32)
Calcium: 6.6 mg/dL — ABNORMAL LOW (ref 8.4–10.5)
Creatinine, Ser: 0.93 mg/dL (ref 0.50–1.10)
GFR calc Af Amer: 78 mL/min — ABNORMAL LOW (ref >90)
GFR calc non Af Amer: 67 mL/min — ABNORMAL LOW (ref >90)
Glucose, Bld: 116 mg/dL — ABNORMAL HIGH (ref 70–99)
POTASSIUM: 3.5 mmol/L (ref 3.5–5.1)
Sodium: 131 mmol/L — ABNORMAL LOW (ref 135–145)
Total Protein: 4 g/dL — ABNORMAL LOW (ref 6.0–8.3)

## 2014-05-24 LAB — CBC
HCT: 31.8 % — ABNORMAL LOW (ref 36.0–46.0)
HEMOGLOBIN: 10.9 g/dL — AB (ref 12.0–15.0)
MCH: 36 pg — ABNORMAL HIGH (ref 26.0–34.0)
MCHC: 34.3 g/dL (ref 30.0–36.0)
MCV: 105 fL — ABNORMAL HIGH (ref 78.0–100.0)
Platelets: 121 10*3/uL — ABNORMAL LOW (ref 150–400)
RBC: 3.03 MIL/uL — ABNORMAL LOW (ref 3.87–5.11)
RDW: 13 % (ref 11.5–15.5)
WBC: 8.9 10*3/uL (ref 4.0–10.5)

## 2014-05-24 LAB — POCT I-STAT 3, ART BLOOD GAS (G3+)
Acid-base deficit: 3 mmol/L — ABNORMAL HIGH (ref 0.0–2.0)
BICARBONATE: 19.1 meq/L — AB (ref 20.0–24.0)
O2 Saturation: 95 %
TCO2: 20 mmol/L (ref 0–100)
pCO2 arterial: 25.6 mmHg — ABNORMAL LOW (ref 35.0–45.0)
pH, Arterial: 7.482 — ABNORMAL HIGH (ref 7.350–7.450)
pO2, Arterial: 67 mmHg — ABNORMAL LOW (ref 80.0–100.0)

## 2014-05-24 LAB — CORTISOL: CORTISOL PLASMA: 29.5 ug/dL

## 2014-05-24 LAB — BLOOD GAS, ARTERIAL
ACID-BASE DEFICIT: 4.6 mmol/L — AB (ref 0.0–2.0)
Bicarbonate: 18 mEq/L — ABNORMAL LOW (ref 20.0–24.0)
Drawn by: 418751
FIO2: 0.3 %
MECHVT: 480 mL
O2 Saturation: 99 %
PCO2 ART: 22.8 mmHg — AB (ref 35.0–45.0)
PEEP: 5 cmH2O
PO2 ART: 112 mmHg — AB (ref 80.0–100.0)
Patient temperature: 98.6
RATE: 24 resp/min
TCO2: 18.7 mmol/L (ref 0–100)
pH, Arterial: 7.509 — ABNORMAL HIGH (ref 7.350–7.450)

## 2014-05-24 LAB — LACTIC ACID, PLASMA: Lactic Acid, Venous: 2.5 mmol/L — ABNORMAL HIGH (ref 0.5–2.2)

## 2014-05-24 MED ORDER — NOREPINEPHRINE BITARTRATE 1 MG/ML IV SOLN
2.0000 ug/min | INTRAVENOUS | Status: DC
  Administered 2014-05-24: 15 ug/min via INTRAVENOUS
  Administered 2014-05-25: 8 ug/min via INTRAVENOUS
  Filled 2014-05-24 (×2): qty 16

## 2014-05-24 MED ORDER — POTASSIUM CHLORIDE 20 MEQ/15ML (10%) PO SOLN
20.0000 meq | Freq: Once | ORAL | Status: AC
  Administered 2014-05-24: 20 meq via ORAL
  Filled 2014-05-24: qty 15

## 2014-05-24 MED ORDER — SODIUM CHLORIDE 0.9 % IV SOLN
INTRAVENOUS | Status: DC
  Administered 2014-05-24: 12:00:00 via INTRAVENOUS

## 2014-05-24 MED ORDER — SODIUM CHLORIDE 0.9 % IV SOLN
10.0000 mg/h | INTRAVENOUS | Status: DC
  Administered 2014-05-24 – 2014-05-25 (×2): 10 mg/h via INTRAVENOUS
  Filled 2014-05-24 (×2): qty 10

## 2014-05-24 MED ORDER — POTASSIUM CHLORIDE 20 MEQ/15ML (10%) PO SOLN
40.0000 meq | Freq: Once | ORAL | Status: DC
  Filled 2014-05-24: qty 30

## 2014-05-24 NOTE — Progress Notes (Signed)
eLink Physician-Brief Progress Note Patient Name: [name] Solis DOB: [birth year] MRN: [id number]   Date of Service  05/24/2014  HPI/Events of Note  I have had extensive discussions with family husband. We discussed patients current circumstances and organ failures. We also discussed patient's prior wishes under circumstances such as this. Family has decided to NOT perform resuscitation if arrest but to continue current medical support for now.  He wants to meet 10 am with other family and finalize dc all support 1./14. Wishes to start comfort meds overnight.   eICU Interventions       Intervention Category Major Interventions: End of life / care limitation discussion  Nelda BucksFEINSTEIN,DANIEL J. 05/24/2014, 6:24 PM

## 2014-05-24 NOTE — Progress Notes (Signed)
Echocardiogram 2D Echocardiogram has been performed.  Mick Tanguma 05/24/2014, 11:26 AM

## 2014-05-24 NOTE — Progress Notes (Signed)
Subjective: Patient remains unresponsive and on mechanical ventilation. No clinical seizure activity reported. She has remained on propofol and Versed as well as Vimpat and Keppra.  Objective: Current vital signs: BP 88/63 mmHg  Pulse 89  Temp(Src) 98.1 F (36.7 C) (Core (Comment))  Resp 20  Ht 5\' 6"  (1.676 m)  Wt 66.53 kg (146 lb 10.8 oz)  BMI 23.68 kg/m2  SpO2 99%  Neurologic Exam: Pupils were equal and did not react to light. Extraocular movements were absent with oculocephalic maneuvers. No facial asymmetry was noted. Muscle tone remains flaccid throughout. No spontaneous movements and no abnormal posturing noted. Deep tendon reflexes were 2+ and symmetrical. Plantar responses were mute bilaterally.  Medications: I have reviewed the patient's current medications.  CT scan of the head today showed signs of early cerebral edema with reduced gray-white matter delineation, as well as slight effacement of lateral ventricles and cortical sulci, compared to CT scan of 05/14/2014.  EEG monitoring overnight showed continuous burst-suppression pattern of cerebral activity no epileptiform activity. EEG monitoring was discontinued.  Assessment/Plan: 59 year old lady with severe diffuse anoxic brain injury associated with cardiac arrest on 05/29/2014. Patient is still on sedating medications which are being weaned. Based on initial EEG pattern, as well as female CT changes of early cerebral edema, prognosis is likely poor for functional recovery.  Plan: 1. Continue to wean propofol and Versed. 2. Continue Keppra and Vimpat for AED coverage. 3. We'll keep family informed of clinical developments as dating medications are weaned. They were made aware of her CT scan findings and that her prognosis is not good for recovery, from a neurologic standpoint.  We will continue to follow this patient closely with you.  C.R. Roseanne RenoStewart, MD [facility] [phone]  05/24/2014  2:45 PM

## 2014-05-24 NOTE — Progress Notes (Signed)
Patient Name: [name] Solis Date of Encounter: 05/24/2014     Active Problems:   Cardiac arrest   Acute respiratory failure with hypoxia   Encephalopathy acute   Anoxic brain injury   Status epilepticus    SUBJECTIVE  The patient is still unresponsive, sedated on vent.  She is getting a portable CT of the head now. Review of telemetry shows that she appears to be back in normal sinus rhythm with a narrow QRS.  We will get 12-lead EKG. Chest x-ray reviewed by me shows relatively normal heart size and there are bilateral densities consistent with pleural fluid and/or interstitial edema She was currently on phenylephrine and levophed pressor support.  CURRENT MEDS . ampicillin-sulbactam (UNASYN) IV  3 g Intravenous Q6H  . antiseptic oral rinse  7 mL Mouth Rinse QID  . chlorhexidine  15 mL Mouth Rinse BID  . enoxaparin (LOVENOX) injection  40 mg Subcutaneous Q24H  . feeding supplement (PRO-STAT SUGAR FREE 64)  30 mL Per Tube Daily  . insulin aspart  0-15 Units Subcutaneous 6 times per day  . lacosamide (VIMPAT) IV  200 mg Intravenous Q12H  . levETIRAcetam  1,500 mg Intravenous Q12H  . pantoprazole sodium  40 mg Per Tube Q24H    OBJECTIVE  Filed Vitals:   05/24/14 0600 05/24/14 0700 05/24/14 0800 05/24/14 0900  BP:      Pulse: 80 82 79 77  Temp: 98.6 F (37 C) 98.8 F (37.1 C) 99 F (37.2 C) 99.3 F (37.4 C)  TempSrc: Core (Comment) Core (Comment) Core (Comment)   Resp: Height:      Weight:      SpO2: 97% 97% 100% 99%    Intake/Output Summary (Last 24 hours) at 05/24/14 0957 Last data filed at 05/24/14 0900  Gross per 24 hour  Intake 5836.23 ml  Output    980 ml  Net 4856.23 ml   Filed Weights   05/18/2014 1100 05/23/14 0800 05/24/14 0500  Weight: 121 lb 4.1 oz (55 kg) 143 lb 15.4 oz (65.3 kg) 146 lb 10.8 oz (66.53 kg)    PHYSICAL EXAM  General: Intubated. Sedated on vent. HEENT: Normal Neck: Supple without bruits or JVD. Lungs:  Resp regular and unlabored, CTA. Heart: RRR no s3, s4, or murmurs. Abdomen: Soft, non-tender, non-distended, BS + x 4.  Extremities: No clubbing, cyanosis or edema.    Accessory Clinical Findings  CBC  Recent Labs  05/23/14 0400 05/24/14 0534  WBC 10.3 8.9  HGB 11.8* 10.9*  HCT 35.4* 31.8*  MCV 107.9* 105.0*  PLT 139* 121*   Basic Metabolic Panel  Recent Labs  05/23/14 0400 05/23/14 1600 05/24/14 0534  NA 135 132* 131*  K 5.2* 4.4 3.5  CL 114* 116* 103  CO2 15* 13* 21  GLUCOSE 112* 133* 116*  BUN CREATININE 0.92 0.83 0.93  CALCIUM 6.7* 6.1* 6.6*  MG 1.1* 1.7  --   PHOS  --  2.3  --    Liver Function Tests  Recent Labs  05/23/14 0400 05/24/14 0534  AST 257* 235*  ALT 132* 120*  ALKPHOS 74 67  BILITOT 1.4* 1.1  PROT 4.8* 4.0*  ALBUMIN 2.2* 1.6*   No results for input(s): LIPASE, AMYLASE in the last 72 hours. Cardiac Enzymes  Recent Labs  05/25/2014 1914 05/22/14 0025 05/23/14 0400  TROPONINI 5.26* 4.67* 3.76*   BNP Invalid input(s): POCBNP D-Dimer No results for input(s): DDIMER in the last  72 hours. Hemoglobin A1C No results for input(s): HGBA1C in the last 72 hours. Fasting Lipid Panel  Recent Labs  05/23/14 0822  TRIG 107   Thyroid Function Tests No results for input(s): TSH, T4TOTAL, T3FREE, THYROIDAB in the last 72 hours.  Invalid input(s): FREET3  TELE  Normal sinus rhythm.  Narrow QRS.  There appear to be P waves.  ECG  Repeat is pending  Radiology/Studies  Ct Head Wo Contrast  10-22-2014   CLINICAL DATA:  Acute cardiac arrest earlier in the day  EXAM: CT HEAD WITHOUT CONTRAST  TECHNIQUE: Contiguous axial images were obtained from the base of the skull through the vertex without intravenous contrast.  COMPARISON:  None.  FINDINGS: The ventricles are normal in size and configuration. There is, however, mild frontal atrophy bilaterally. There is no intracranial mass, hemorrhage, extra-axial fluid collection, or  midline shift. There remains gray -white differentiation without appreciable edema by CT. No focal gray-white compartment lesions are identified. No acute infarct is apparent. The bony calvarium appears intact. The mastoid air cells are clear. There is diffuse opacification of the visualize left maxillary antrum. There is rightward deviation of the nasal septum. There is opacification of several ethmoid air cells on the left. Patient is intubated.  IMPRESSION: Mild frontal atrophy bilaterally. No intracranial edema is appreciable. There remains gray-white compartment differentiation. No intracranial mass, hemorrhage, or extra-axial fluid. No acute infarct apparent. Paranasal sinus disease. Rightward deviation nasal septum.   Electronically Signed   By: Bretta BangWilliam  Woodruff M.D.   On: 006-04-2015 08:41   Dg Chest Port 1 View  05/24/2014   CLINICAL DATA:  Respiratory failure.  EXAM: PORTABLE CHEST - 1 VIEW  COMPARISON:  05/23/2014.  FINDINGS: Endotracheal tube, NG tube, central line in stable position. The mediastinal structures are unremarkable. Heart size stable. Progressive bilateral pulmonary alveolar infiltrates and bilateral pleural effusions noted. These findings are consistent with progressive congestive heart failure. Bilateral pneumonia cannot be excluded. No pneumothorax.  IMPRESSION: 1. Stable line and tube positions. 2. Progressive congestive heart failure with progressive bilateral pulmonary edema and pleural effusions. Superimposed pneumonia cannot be excluded.   Electronically Signed   By: Maisie Fushomas  Register   On: 05/24/2014 07:19   Dg Chest Port 1 View  05/23/2014   CLINICAL DATA:  Respiratory failure.  EXAM: PORTABLE CHEST - 1 VIEW  COMPARISON:  006-04-2015.  FINDINGS: Endotracheal tube, NG tube, left central line in stable position. Mediastinum and hilar structures are stable. Heart size is stable. However there is interim development of pulmonary venous congestion and bilateral interstitial  prominence with small pleural effusions. These findings suggest congestive heart failure. Superimposed pneumonia cannot be excluded . No pneumothorax.  IMPRESSION: 1. Lines and tubes in stable position. 2. Interim development of pulmonary venous congestion, bilateral interstitial infiltrates, and small pleural effusions. These findings suggest congestive heart failure. Superimposed pneumonia cannot be excluded.   Electronically Signed   By: Maisie Fushomas  Register   On: 05/23/2014 07:14   Dg Chest Portable 1 View  10-22-2014   CLINICAL DATA:  Hypoxia.  Status post cardiac arrest  EXAM: PORTABLE CHEST - 1 VIEW  COMPARISON:  Study obtained earlier in the day  FINDINGS: Endotracheal tube tip is 4.1 cm above the carina. Nasogastric tube tip and side port are below the diaphragm. Central catheter tip is in the superior vena cava. No pneumothorax. Lungs are clear. Heart size and pulmonary vascularity are normal. No adenopathy.  IMPRESSION: Tube and catheter positions as described without pneumothorax. No edema  or consolidation.   Electronically Signed   By: Bretta Bang M.D.   On: 06/11/2014 08:09   Dg Chest Port 1 View  06/10/2014   CLINICAL DATA:  Code STEMI.  Intubated.  NG tube placed.  EXAM: PORTABLE CHEST - 1 VIEW  COMPARISON:  None.  FINDINGS: Endotracheal tube tip measures 4.5 cm above the carinal. Enteric tube tip is off the field of view but below the left hemidiaphragm. Normal heart size and pulmonary vascularity. Mediastinal contours appear intact. Lungs appear clear and expanded.  IMPRESSION: Appliances appear in satisfactory location. No evidence of active pulmonary disease.   Electronically Signed   By: Burman Nieves M.D.   On: 05/24/2014 06:52    ASSESSMENT AND PLAN 1. Status post VF arrest 2. Wide-complex tachycardia has resolved. 3. Ongoing hypotension requiring Levophed, and phenylephrine at the present time 4.  Borderline hypokalemia.  Will replete. Plan: Continue supportive care.  2-D echo to be done today.  EKG requested for today.   Signed, Cassell Clement MD

## 2014-05-24 NOTE — Progress Notes (Signed)
PULMONARY / CRITICAL CARE MEDICINE HISTORY AND PHYSICAL EXAMINATION   Name: [name] Solis MRN: [id number] DOB: 09-Nov-1955    ADMISSION DATE:  06/01/2014  PRIMARY SERVICE: PCCM  CHIEF COMPLAINT:  Vfib arrest  BRIEF PATIENT DESCRIPTION:  59 yo female with VF cardiac arrest with 10 minutes before ROSC.  SIGNIFICANT EVENTS:  1/10 Admitted, Vfib arrest, EKG with ST depressions, Hypothermia protocol started, cardiology consulted 1/11 Rewarming; Status epilepticus >> neurology consulted 1/12 Wide complex tachyardia  STUDIES: 1/10 CT head >> mild frontal atrophy b/l, Rt nasal septal deviation 1/10 Lt heart cath >> minimal, non obstructive CAD 1/12 24h EEG>>> severe encephalopathy suspect r/t anoxia  1/13 CT head>>> 1/13 2D echo>>>  SUBJECTIVE:  Weaning pressors.  Off vasopressin, weaning levo/neo.  Remains heavily sedated/proprofol r/t status epilepticus.   VITAL SIGNS: Temp:  [96.3 F (35.7 C)-99.9 F (37.7 C)] 99.3 F (37.4 C) (01/13 0900) Pulse Rate:  [73-106] 77 (01/13 0900) Resp:  [23-24] 24 (01/13 0900) BP: (88-115)/(63-83) 88/63 mmHg (01/13 0300) SpO2:  [97 %-100 %] 99 % (01/13 0900) FiO2 (%):  [30 %] 30 % (01/13 0900) Weight:  [146 lb 10.8 oz (66.53 kg)] 146 lb 10.8 oz (66.53 kg) (01/13 0500) HEMODYNAMICS: CVP:  [7 mmHg-16 mmHg] 7 mmHg VENTILATOR SETTINGS: Vent Mode:  [-] PRVC FiO2 (%):  [30 %] 30 % Set Rate:  [24 bmp] 24 bmp Vt Set:  [480 mL] 480 mL PEEP:  [5 cmH20] 5 cmH20 Plateau Pressure:  [17 cmH20-20 cmH20] 20 cmH20 INTAKE / OUTPUT: Intake/Output      01/12 0701 - 01/13 0700 01/13 0701 - 01/14 0700   I.V. (mL/kg) 5432 (81.6) 304.6 (4.6)   NG/GT 695 120   IV Piggyback 845    Total Intake(mL/kg) 6972 (104.8) 424.6 (6.4)   Urine (mL/kg/hr) 940 (0.6) 40 (0.2)   Total Output 940 40   Net +6032 +384.6          PHYSICAL EXAMINATION: General: no distress Neuro: RASS -5 HEENT:  Pupils 2mm, non-reactive Cardiovascular: regular Lungs: scattered rhonchi  R>L, diminished L Abdomen: soft Skin:  No rashes MSK: 1+ edema  LABS:  CBC  Recent Labs Lab 05/22/14 0400  05/22/14 1635 05/23/14 0400 05/24/14 0534  WBC 12.1*  --   --  10.3 8.9  HGB 12.1  < > 13.6 11.8* 10.9*  HCT 35.2*  < > 40.0 35.4* 31.8*  PLT 100*  --   --  139* 121*  < > = values in this interval not displayed.   Coag's  Recent Labs Lab 05/20/2014 0604 06/05/2014 1015 05/20/2014 1514 05/31/2014 2245  APTT 34 45* 28 28  INR 1.23  --   --  0.98   BMET  Recent Labs Lab 05/23/14 0400 05/23/14 1600 05/24/14 0534  NA 135 132* 131*  K 5.2* 4.4 3.5  CL 114* 116* 103  CO2 15* 13* 21  BUN 12 10 9   CREATININE 0.92 0.83 0.93  GLUCOSE 112* 133* 116*   Electrolytes  Recent Labs Lab 05/23/14 0400 05/23/14 1600 05/24/14 0534  CALCIUM 6.7* 6.1* 6.6*  MG 1.1* 1.7  --   PHOS  --  2.3  --    Sepsis Markers  Recent Labs Lab 05/18/2014 0611 05/23/14 0530 05/23/14 1600  LATICACIDVEN 9.93* 3.9* 4.7*   ABG  Recent Labs Lab 05/23/14 0411 05/23/14 1645 05/24/14 0348  PHART 7.243* 7.371 7.509*  PCO2ART 34.0* 20.5* 22.8*  PO2ART 148.0* 134.0* 112.0*   Liver Enzymes  Recent Labs Lab 06/08/2014 0604 05/23/14  0400 05/24/14 0534  AST 159* 257* 235*  ALT 71* 132* 120*  ALKPHOS 93 74 67  BILITOT 1.7* 1.4* 1.1  ALBUMIN 2.4* 2.2* 1.6*   Cardiac Enzymes  Recent Labs Lab 2014-06-01 1914 05/22/14 0025 05/23/14 0400  TROPONINI 5.26* 4.67* 3.76*   Glucose  Recent Labs Lab 05/23/14 1115 05/23/14 1608 05/23/14 2030 05/24/14 0050 05/24/14 0505 05/24/14 0724  GLUCAP 97 113* 115* 126* 111* 104*    Imaging Dg Chest Port 1 View  05/24/2014   CLINICAL DATA:  Respiratory failure.  EXAM: PORTABLE CHEST - 1 VIEW  COMPARISON:  05/23/2014.  FINDINGS: Endotracheal tube, NG tube, central line in stable position. The mediastinal structures are unremarkable. Heart size stable. Progressive bilateral pulmonary alveolar infiltrates and bilateral pleural effusions noted.  These findings are consistent with progressive congestive heart failure. Bilateral pneumonia cannot be excluded. No pneumothorax.  IMPRESSION: 1. Stable line and tube positions. 2. Progressive congestive heart failure with progressive bilateral pulmonary edema and pleural effusions. Superimposed pneumonia cannot be excluded.   Electronically Signed   By: Maisie Fus  Register   On: 05/24/2014 07:19   Dg Chest Port 1 View  05/23/2014   CLINICAL DATA:  Respiratory failure.  EXAM: PORTABLE CHEST - 1 VIEW  COMPARISON:  June 01, 2014.  FINDINGS: Endotracheal tube, NG tube, left central line in stable position. Mediastinum and hilar structures are stable. Heart size is stable. However there is interim development of pulmonary venous congestion and bilateral interstitial prominence with small pleural effusions. These findings suggest congestive heart failure. Superimposed pneumonia cannot be excluded . No pneumothorax.  IMPRESSION: 1. Lines and tubes in stable position. 2. Interim development of pulmonary venous congestion, bilateral interstitial infiltrates, and small pleural effusions. These findings suggest congestive heart failure. Superimposed pneumonia cannot be excluded.   Electronically Signed   By: Maisie Fus  Register   On: 05/23/2014 07:14   ASSESSMENT / PLAN:  PULMONARY ETT 1/10 >> A: Acute respiratory failure 2nd to cardiac arrest. P:   Full vent support Decrease RR to 20  F/u CXR, ABG  CARDIOVASCULAR A: VF arrest with cardiogenic shock >> no significant CAD on cath. Wide complex tachycardia 1/12. Shock >> cardiogenic, meds, and ? Sepsis. P:   Monitor hemodynamics Continue wean pressors as able  Goal CVP > 10 -- CVP 7, hold on aggressive volume with pulm edema  Pressors to keep SBP > 90, MAP > 65 F/u lactic acid 1/13  RENAL A: AKI 2nd to cardiogenic shock >> resolved. Gap/non gap metabolic acidosis - resolved.  Hypokalemia >> resolved. Hyperkalemia. Hypomagnesemia. Hyponatremia  P:    D/c HCO3  Gentle NS  F/u BMET, ABG Monitor renal fx, urine outpt  GASTROINTESTINAL A: Shock liver. Nutrition. P:   F/u LFT Protonix for SUP Tube feeds - on hold r/t high residual   HEMATOLOGIC A: Thrombocytopenia. P:   Follow CBC SCD, Lovenox for DVT prevention  INFECTIOUS A: Possible aspiration PNA. P:   Day 2 unasyn  Blood 1/11 >> Sputum 1/11 >>  ENDOCRINE A: Hyperglycemia. P:   SSI  NEUROLOGIC A: Acute encephalopathy 2nd to cardiac arrest with concern for anoxic encephalopathy. Status epilepticus. P:   Diprivan coma RASS goal -5 until seizures controlled CT head pending  Vimpat, Keppra per neurology   No family at bedside 1/13.  Family conference due 1/17:  Dirk Dress, NP 05/24/2014  10:25 AM Pager: [phone] or [phone]   Reviewed above, examined.  Remains on full vent support, pressors, and diprivan coma.  Scattered rhonchi.  CT head pending.  Will need to discuss with family goals of care depending on CT head results.  CC time by me independent of APP time is 35 minutes.  Coralyn Helling, MD [facility] Pulmonary/Critical Care 05/24/2014, 11:34 AM Pager:  [phone] After 3pm call: [phone]

## 2014-05-24 NOTE — Progress Notes (Signed)
LTM discontinued.  

## 2014-05-25 ENCOUNTER — Inpatient Hospital Stay (HOSPITAL_COMMUNITY): Payer: No Typology Code available for payment source

## 2014-05-25 ENCOUNTER — Encounter (HOSPITAL_COMMUNITY): Admission: EM | Disposition: E | Payer: Self-pay | Source: Home / Self Care | Attending: Emergency Medicine

## 2014-05-25 ENCOUNTER — Encounter (HOSPITAL_COMMUNITY): Payer: Self-pay | Admitting: Certified Registered Nurse Anesthetist

## 2014-05-25 DIAGNOSIS — J9601 Acute respiratory failure with hypoxia: Secondary | ICD-10-CM

## 2014-05-25 HISTORY — PX: ORGAN PROCUREMENT: SHX5270

## 2014-05-25 LAB — POCT I-STAT 3, ART BLOOD GAS (G3+)
ACID-BASE DEFICIT: 1 mmol/L (ref 0.0–2.0)
Bicarbonate: 25.5 mEq/L — ABNORMAL HIGH (ref 20.0–24.0)
Bicarbonate: 24.2 mEq/L — ABNORMAL HIGH (ref 20.0–24.0)
O2 SAT: 99 %
O2 Saturation: 77 %
PH ART: 7.398 (ref 7.350–7.450)
TCO2: 27 mmol/L (ref 0–100)
TCO2: 25 mmol/L (ref 0–100)
pCO2 arterial: 44.4 mmHg (ref 35.0–45.0)
pCO2 arterial: 39.2 mmHg (ref 35.0–45.0)
pH, Arterial: 7.366 (ref 7.350–7.450)
pO2, Arterial: 43 mmHg — ABNORMAL LOW (ref 80.0–100.0)
pO2, Arterial: 139 mmHg — ABNORMAL HIGH (ref 80.0–100.0)

## 2014-05-25 LAB — ABO/RH
ABO/RH(D): A POS
ABO/RH(D): A POS
PT AG TYPE: NEGATIVE
PT AG TYPE: NEGATIVE

## 2014-05-25 LAB — BASIC METABOLIC PANEL
Anion gap: 4 — ABNORMAL LOW (ref 5–15)
Anion gap: 3 — ABNORMAL LOW (ref 5–15)
BUN: 10 mg/dL (ref 6–23)
BUN: 8 mg/dL (ref 6–23)
CALCIUM: 6.7 mg/dL — AB (ref 8.4–10.5)
CALCIUM: 6.9 mg/dL — AB (ref 8.4–10.5)
CO2: 25 mmol/L (ref 19–32)
CO2: 29 mmol/L (ref 19–32)
Chloride: 111 mEq/L (ref 96–112)
Chloride: 110 mEq/L (ref 96–112)
Creatinine, Ser: 0.69 mg/dL (ref 0.50–1.10)
Creatinine, Ser: 0.76 mg/dL (ref 0.50–1.10)
GFR calc Af Amer: >90 mL/min (ref >90)
GFR calc Af Amer: >90 mL/min (ref >90)
GFR calc non Af Amer: >90 mL/min (ref >90)
GLUCOSE: 99 mg/dL (ref 70–99)
GLUCOSE: 91 mg/dL (ref 70–99)
POTASSIUM: 3 mmol/L — AB (ref 3.5–5.1)
Potassium: 3.5 mmol/L (ref 3.5–5.1)
SODIUM: 140 mmol/L (ref 135–145)
SODIUM: 142 mmol/L (ref 135–145)

## 2014-05-25 LAB — CBC WITH DIFFERENTIAL/PLATELET
BASOS ABS: 0 10*3/uL (ref 0.0–0.1)
Basophils Relative: 0 % (ref 0–1)
Eosinophils Absolute: 0.1 10*3/uL (ref 0.0–0.7)
Eosinophils Relative: 1 % (ref 0–5)
HEMATOCRIT: 27.3 % — AB (ref 36.0–46.0)
Hemoglobin: 9.4 g/dL — ABNORMAL LOW (ref 12.0–15.0)
LYMPHS ABS: 1 10*3/uL (ref 0.7–4.0)
Lymphocytes Relative: 16 % (ref 12–46)
MCH: 36.2 pg — ABNORMAL HIGH (ref 26.0–34.0)
MCHC: 34.4 g/dL (ref 30.0–36.0)
MCV: 105 fL — ABNORMAL HIGH (ref 78.0–100.0)
MONO ABS: 0.8 10*3/uL (ref 0.1–1.0)
Monocytes Relative: 13 % — ABNORMAL HIGH (ref 3–12)
NEUTROS PCT: 70 % (ref 43–77)
Neutro Abs: 4.5 10*3/uL (ref 1.7–7.7)
Platelets: 105 10*3/uL — ABNORMAL LOW (ref 150–400)
RBC: 2.6 MIL/uL — ABNORMAL LOW (ref 3.87–5.11)
RDW: 13.4 % (ref 11.5–15.5)
WBC: 6.4 10*3/uL (ref 4.0–10.5)

## 2014-05-25 LAB — CULTURE, RESPIRATORY W GRAM STAIN

## 2014-05-25 LAB — URINALYSIS, ROUTINE W REFLEX MICROSCOPIC
Bilirubin Urine: NEGATIVE
GLUCOSE, UA: NEGATIVE mg/dL
HGB URINE DIPSTICK: NEGATIVE
Ketones, ur: NEGATIVE mg/dL
LEUKOCYTES UA: NEGATIVE
NITRITE: NEGATIVE
Protein, ur: NEGATIVE mg/dL
Specific Gravity, Urine: 1.017 (ref 1.005–1.030)
Urobilinogen, UA: 1 mg/dL (ref 0.0–1.0)
pH: 5 (ref 5.0–8.0)

## 2014-05-25 LAB — CBC
HCT: 27.5 % — ABNORMAL LOW (ref 36.0–46.0)
Hemoglobin: 9.7 g/dL — ABNORMAL LOW (ref 12.0–15.0)
MCH: 36.3 pg — ABNORMAL HIGH (ref 26.0–34.0)
MCHC: 35.3 g/dL (ref 30.0–36.0)
MCV: 103 fL — ABNORMAL HIGH (ref 78.0–100.0)
PLATELETS: 112 10*3/uL — AB (ref 150–400)
RBC: 2.67 MIL/uL — ABNORMAL LOW (ref 3.87–5.11)
RDW: 13.3 % (ref 11.5–15.5)
WBC: 7.1 10*3/uL (ref 4.0–10.5)

## 2014-05-25 LAB — GLUCOSE, CAPILLARY
GLUCOSE-CAPILLARY: 102 mg/dL — AB (ref 70–99)
GLUCOSE-CAPILLARY: 113 mg/dL — AB (ref 70–99)
Glucose-Capillary: 101 mg/dL — ABNORMAL HIGH (ref 70–99)
Glucose-Capillary: 104 mg/dL — ABNORMAL HIGH (ref 70–99)

## 2014-05-25 LAB — PHOSPHORUS: Phosphorus: 1.5 mg/dL — ABNORMAL LOW (ref 2.3–4.6)

## 2014-05-25 LAB — CULTURE, RESPIRATORY

## 2014-05-25 LAB — MAGNESIUM: MAGNESIUM: 1.6 mg/dL (ref 1.5–2.5)

## 2014-05-25 SURGERY — SURGICAL PROCUREMENT, ORGAN
Site: Abdomen

## 2014-05-25 MED ORDER — POTASSIUM PHOSPHATES 15 MMOLE/5ML IV SOLN
30.0000 mmol | Freq: Once | INTRAVENOUS | Status: AC
  Administered 2014-05-25: 30 mmol via INTRAVENOUS
  Filled 2014-05-25: qty 10

## 2014-05-25 MED ORDER — MAGNESIUM SULFATE 2 GM/50ML IV SOLN
2.0000 g | Freq: Once | INTRAVENOUS | Status: AC
  Administered 2014-05-25: 2 g via INTRAVENOUS
  Filled 2014-05-25: qty 50

## 2014-05-25 MED ORDER — MORPHINE BOLUS VIA INFUSION
10.0000 mg | INTRAVENOUS | Status: DC | PRN
  Administered 2014-05-26: 10 mg via INTRAVENOUS
  Filled 2014-05-25 (×2): qty 10

## 2014-05-25 MED ORDER — HEPARIN SODIUM (PORCINE) 1000 UNIT/ML IJ SOLN
7000.0000 [IU] | Freq: Once | INTRAMUSCULAR | Status: AC
  Administered 2014-05-26: 7000 [IU] via INTRAVENOUS
  Filled 2014-05-25: qty 7

## 2014-05-25 SURGICAL SUPPLY — 63 items
BLADE 10 SAFETY STRL DISP (BLADE) ×3 IMPLANT
BLADE SURG 10 STRL SS (BLADE) ×6 IMPLANT
BLADE SURG ROTATE 9660 (MISCELLANEOUS) IMPLANT
CANNULA VESSEL W/WING WO/VALVE (CANNULA) IMPLANT
CLIP TI MEDIUM 24 (CLIP) IMPLANT
CLIP TI WIDE RED SMALL 24 (CLIP) IMPLANT
CONT SPEC STER OR (MISCELLANEOUS) ×12 IMPLANT
COVER MAYO STAND STRL (DRAPES) ×3 IMPLANT
COVER SURGICAL LIGHT HANDLE (MISCELLANEOUS) ×6 IMPLANT
COVER TABLE BACK 60X90 (DRAPES) IMPLANT
DRAPE PROXIMA HALF (DRAPES) IMPLANT
DRAPE SLUSH MACHINE 52X66 (DRAPES) ×3 IMPLANT
DRESSING TELFA 8X3 (GAUZE/BANDAGES/DRESSINGS) IMPLANT
DRSG COVADERM 4X10 (GAUZE/BANDAGES/DRESSINGS) ×6 IMPLANT
DURAPREP 26ML APPLICATOR (WOUND CARE) IMPLANT
ELECT BLADE 6.5 EXT (BLADE) IMPLANT
ELECT REM PT RETURN 9FT ADLT (ELECTROSURGICAL)
ELECTRODE REM PT RTRN 9FT ADLT (ELECTROSURGICAL) IMPLANT
GAUZE SPONGE 4X4 16PLY XRAY LF (GAUZE/BANDAGES/DRESSINGS) IMPLANT
GLOVE BIO SURGEON STRL SZ8 (GLOVE) ×12 IMPLANT
GOWN STRL REUS W/ TWL LRG LVL3 (GOWN DISPOSABLE) ×4 IMPLANT
GOWN STRL REUS W/TWL LRG LVL3 (GOWN DISPOSABLE) ×8
KIT POST MORTEM ADULT 36X90 (BAG) ×3 IMPLANT
KIT ROOM TURNOVER OR (KITS) ×3 IMPLANT
LOOP VESSEL MAXI BLUE (MISCELLANEOUS) ×3 IMPLANT
LOOP VESSEL MINI RED (MISCELLANEOUS) ×3 IMPLANT
MANIFOLD NEPTUNE II (INSTRUMENTS) ×3 IMPLANT
NEEDLE BIOPSY 14X6 SOFT TISS (NEEDLE) IMPLANT
NS IRRIG 1000ML POUR BTL (IV SOLUTION) IMPLANT
PACK AORTA (CUSTOM PROCEDURE TRAY) ×3 IMPLANT
PAD ARMBOARD 7.5X6 YLW CONV (MISCELLANEOUS) ×6 IMPLANT
PENCIL BUTTON HOLSTER BLD 10FT (ELECTRODE) IMPLANT
SOLUTION BETADINE 4OZ (MISCELLANEOUS) IMPLANT
SPONGE INTESTINAL PEANUT (DISPOSABLE) IMPLANT
SPONGE LAP 18X18 X RAY DECT (DISPOSABLE) IMPLANT
STAPLER VISISTAT (STAPLE) ×3 IMPLANT
STAPLER VISISTAT 35W (STAPLE) ×3 IMPLANT
SUCTION POOLE TIP (SUCTIONS) ×6 IMPLANT
SUT BONE WAX W31G (SUTURE) IMPLANT
SUT ETHIBOND 5 LR DA (SUTURE) ×15 IMPLANT
SUT ETHILON 1 LR 30 (SUTURE) ×9 IMPLANT
SUT ETHILON 2 LR (SUTURE) IMPLANT
SUT PROLENE 3 0 SH 48 (SUTURE) ×6 IMPLANT
SUT PROLENE 6 0 BV (SUTURE) ×3 IMPLANT
SUT SILK 0 TIES 10X30 (SUTURE) ×3 IMPLANT
SUT SILK 1 SH (SUTURE) IMPLANT
SUT SILK 1 TIES 10X30 (SUTURE) IMPLANT
SUT SILK 2 0 (SUTURE)
SUT SILK 2 0 SH (SUTURE) IMPLANT
SUT SILK 2 0 SH CR/8 (SUTURE) IMPLANT
SUT SILK 2 0 TIES 10X30 (SUTURE) ×3 IMPLANT
SUT SILK 2-0 18XBRD TIE 12 (SUTURE) IMPLANT
SUT SILK 3 0 TIES 10X30 (SUTURE) IMPLANT
SWAB COLLECTION DEVICE MRSA (MISCELLANEOUS) IMPLANT
SYRINGE TOOMEY DISP (SYRINGE) ×3 IMPLANT
TAPE UMBILICAL 1/8 X36 TWILL (MISCELLANEOUS) ×6 IMPLANT
TOWEL OR 17X24 6PK STRL BLUE (TOWEL DISPOSABLE) ×3 IMPLANT
TOWEL OR 17X26 10 PK STRL BLUE (TOWEL DISPOSABLE) ×6 IMPLANT
TUBE ANAEROBIC SPECIMEN COL (MISCELLANEOUS) IMPLANT
TUBE CONNECTING 12'X1/4 (SUCTIONS) ×1
TUBE CONNECTING 12X1/4 (SUCTIONS) ×2 IMPLANT
WATER STERILE IRR 1000ML POUR (IV SOLUTION) IMPLANT
YANKAUER SUCT BULB TIP NO VENT (SUCTIONS) ×6 IMPLANT

## 2014-05-25 NOTE — Progress Notes (Signed)
eLink Physician-Brief Progress Note Patient Name: [name] Solis DOB: [birth year] MRN: [id number]   Date of Service  05/16/2014  HPI/Events of Note  K, Phos low  eICU Interventions  Kphos now     Intervention Category Intermediate Interventions: Electrolyte abnormality - evaluation and management  Marley Charlot 06/06/2014, 5:14 AM

## 2014-05-25 NOTE — Progress Notes (Signed)
eLink Physician-Brief Progress Note Patient Name: [name] Solis DOB: 11-Oct-1955 MRN: [id number]   Date of Service  05/13/2014  HPI/Events of Note  Nursing requesting prn sedation for terminal care  eICU Interventions  Added morphine sulphate 10 mg iv q 15 m prn     Intervention Category Minor Interventions: Routine modifications to care plan (e.g. PRN medications for pain, fever)  Sandrea HughsMichael Cai Anfinson 05/24/2014, 9:26 PM

## 2014-05-25 NOTE — Progress Notes (Signed)
[facility] Electrolyte Replacement Protocol  Patient Name: [name] Solis DOB: 1956-02-24 MRN: [id number]  Date of Service  06/11/2014   HPI/Events of Note    Recent Labs Lab 05/23/14 0400 05/23/14 1600 05/24/14 0534 05/24/14 1600 06/09/2014 0400  NA 135 132* 131* 134* 140  K 5.2* 4.4 3.5 3.0* 3.0*  CL 114* 116* 103 103 111  CO2 15* 13* 21 21 25   GLUCOSE 112* 133* 116* 106* 99  BUN 12 10 9 16 10   CREATININE 0.92 0.83 0.93 0.77 0.69  CALCIUM 6.7* 6.1* 6.6* 6.8* 6.7*  MG 1.1* 1.7  --   --  1.6  PHOS  --  2.3  --   --  1.5*    Estimated Creatinine Clearance: 72.6 mL/min (by C-G formula based on Cr of 0.69).  Intake/Output      01/13 0701 - 01/14 0700   I.V. (mL/kg) 2166.5 (31)   NG/GT 1145   IV Piggyback 590   Total Intake(mL/kg) 3901.5 (55.8)   Urine (mL/kg/hr) 745 (0.4)   Total Output 745   Net +3156.5       Urine Occurrence 1150 x    - I/O DETAILED x24h    Total I/O In: 1115.6 [I.V.:645.6; NG/GT:225; IV Piggyback:245] Out: 250 [Urine:250] - I/O THIS SHIFT    ASSESSMENT   eICURN Interventions   Electrolyte protocol criteria met. Lab values replaced per protocol. MD notified.   ASSESSMENT: MAJOR ELECTROLYTE    Lorene Dy 06/06/2014, 5:18 AM

## 2014-05-25 NOTE — Progress Notes (Signed)
Progress note  Patient moving towards comfort care. IV heparin bolus ordered per [facility] protocol and pharmacy asked to calculate dose. Spoke with service and protocol calls for 100 units/kg  -Wt= 69.9kg  Plan -Heparin IV 7000 units IV x1 for [facility] -Please call pharmacy with any questions  Thank you Harland Germanndrew Basha Krygier, Pharm D 05/24/2014 2:04 PM

## 2014-05-25 NOTE — Progress Notes (Signed)
NUTRITION FOLLOW-UP  INTERVENTION: Vital AF 1.2 @ 45 ml/hr via NG tube   30 ml Prostat daily.    Tube feeding regimen provides 1396 kcal (106% of needs), 96 grams of protein, and 875 ml of H2O.    NUTRITION DIAGNOSIS: Inadequate oral intake related to inability to eat as evidenced by NPO status; ongoing  Goal: Pt to meet >/= 90% of their estimated nutrition needs; met.    Monitor:  Vent status, TF tolerance, weight trends, labs    ASSESSMENT: Pt had cardiac arrest at home, admitted placed on Arctic sun protocol. Pt has now been rewarmed.  Per H&P pt had a recent GI illness with diarrhea, N/V and poor oral intake over the last week. Pt with NG tube in place, no xray confirmation.   Patient is currently intubated on ventilator support MV: 6.9 L/min Temp (24hrs), Avg:98.5 F (36.9 C), Min:97.9 F (36.6 C), Max:99.1 F (37.3 C)  Propofol: off  Potassium and Phosphorus low being replaced  Height: Ht Readings from Last 1 Encounters:  06/01/2014 _0  (1.676 m)    Weight: Wt Readings from Last 1 Encounters:  05/19/2014 154 lb 2.7 oz (69.93 kg)  Admission weight: 115 lb (52.2 kg) PTA  BMI:  Body mass index is 24.9 kg/(m^2).  Estimated Nutritional Needs: Kcal: 1313 Protein: 80-100 grams Fluid: > 1.5 L/day  Skin: WDL  Diet Order: Diet NPO time specified   Intake/Output Summary (Last 24 hours) at 06/01/2014 1016 Last data filed at 06/04/2014 1007  Gross per 24 hour  Intake 4653.56 ml  Output    970 ml  Net 3683.56 ml    Last BM: PTA   Labs:   Recent Labs Lab 05/23/14 0400 05/23/14 1600 05/24/14 0534 05/24/14 1600 06/04/2014 0400  NA 135 132* 131* 134* 140  K 5.2* 4.4 3.5 3.0* 3.0*  CL 114* 116* 103 103 111  CO2 15* 13* _1 BUN _2 CREATININE 0.92 0.83 0.93 0.77 0.69  CALCIUM 6.7* 6.1* 6.6* 6.8* 6.7*  MG 1.1* 1.7  --   --  1.6  PHOS  --  2.3  --   --  1.5*  GLUCOSE 112* 133* 116* 106* 99    CBG (last 3)   Recent Labs   05/24/2014 0319 06/10/2014 0410 06/05/2014 0815  GLUCAP 104* 102* 113*    Scheduled Meds: . ampicillin-sulbactam (UNASYN) IV  3 g Intravenous Q6H  . antiseptic oral rinse  7 mL Mouth Rinse QID  . chlorhexidine  15 mL Mouth Rinse BID  . enoxaparin (LOVENOX) injection  40 mg Subcutaneous Q24H  . feeding supplement (PRO-STAT SUGAR FREE 64)  30 mL Per Tube Daily  . lacosamide (VIMPAT) IV  200 mg Intravenous Q12H  . levETIRAcetam  1,500 mg Intravenous Q12H  . pantoprazole sodium  40 mg Per Tube Q24H  . potassium phosphate IVPB (mmol)  30 mmol Intravenous Once    Continuous Infusions: . sodium chloride Stopped (06/09/2014 0700)  . sodium chloride Stopped (06/08/2014 1007)  . feeding supplement (VITAL AF 1.2 CAL) 1,000 mL (05/24/14 1653)  . fentaNYL infusion INTRAVENOUS 10 mcg/hr (05/16/2014 0945)  . midazolam (VERSED) infusion 6 mg/hr (05/13/2014 0700)  . morphine 10 mg/hr (06/06/2014 0700)  . norepinephrine (LEVOPHED) Adult infusion 2 mcg/min (06/11/2014 0945)  . phenylephrine (NEO-SYNEPHRINE) Adult infusion Stopped (05/24/14 1200)  . propofol Stopped (05/18/2014 0042)    [place], [place], CNSC [phone] Pager [phone] After Hours Pager

## 2014-05-25 NOTE — Procedures (Signed)
Arterial Catheter Insertion Procedure Note [name] Solis [id number] May 21, 1955  Procedure: Insertion of Arterial Catheter  Indications: Blood pressure monitoring  Procedure Details Consent: Unable to obtain consent because of emergent medical necessity. Time Out: Verified patient identification, verified procedure, site/side was marked, verified correct patient position, special equipment/implants available, medications/allergies/relevent history reviewed, required imaging and test results available.  Performed  Maximum sterile technique was used including antiseptics, cap, gloves, gown, hand hygiene, mask and sheet. Skin prep: Chlorhexidine; local anesthetic administered 20 gauge catheter was inserted into left radial artery using the Seldinger technique.  Evaluation Blood flow good; BP tracing good. Complications: No apparent complications.   [name] Solis,[name] Solis 02-10-2015

## 2014-05-25 NOTE — Progress Notes (Signed)
Patient Name: [name] Solis Date of Encounter: 2014/07/11     Active Problems:   Cardiac arrest   Acute respiratory failure with hypoxia   Encephalopathy acute   Anoxic brain injury   Status epilepticus    SUBJECTIVE  Patient remains unresponsive on vent.  Sedated.  Minimal blood pressure support with Levophed. Rhythm is stable normal sinus rhythm with narrow QRS  CURRENT MEDS . ampicillin-sulbactam (UNASYN) IV  3 g Intravenous Q6H  . antiseptic oral rinse  7 mL Mouth Rinse QID  . chlorhexidine  15 mL Mouth Rinse BID  . enoxaparin (LOVENOX) injection  40 mg Subcutaneous Q24H  . feeding supplement (PRO-STAT SUGAR FREE 64)  30 mL Per Tube Daily  . lacosamide (VIMPAT) IV  200 mg Intravenous Q12H  . levETIRAcetam  1,500 mg Intravenous Q12H  . pantoprazole sodium  40 mg Per Tube Q24H  . potassium phosphate IVPB (mmol)  30 mmol Intravenous Once    OBJECTIVE  Filed Vitals:   21-May-2014 0500 21-May-2014 0600 21-May-2014 0700 21-May-2014 0800  BP:      Pulse: 78 75 73 76  Temp:    98.6 F (37 C)  TempSrc:    Core (Comment)  Resp: 14 14 14 14   Height:      Weight:      SpO2: 100% 100% 100% 100%    Intake/Output Summary (Last 24 hours) at 21-May-2014 0900 Last data filed at 21-May-2014 0830  Gross per 24 hour  Intake 4684.71 ml  Output    930 ml  Net 3754.71 ml   Filed Weights   05/23/14 0800 05/24/14 0500 21-May-2014 0300  Weight: 143 lb 15.4 oz (65.3 kg) 146 lb 10.8 oz (66.53 kg) 154 lb 2.7 oz (69.93 kg)    PHYSICAL EXAM  General: Sedated on vent HEENT:  Normal  Neck: Supple without bruits or JVD. Lungs:  Resp regular and unlabored, CTA. Heart: RRR no s3, s4, or murmurs. Abdomen: Soft, non-tender, non-distended, BS + x 4.  Extremities: No clubbing, cyanosis or edema. DP/PT/Radials 2+ and equal bilaterally.  Accessory Clinical Findings  CBC  Recent Labs  05/24/14 0534 21-May-2014 0400  WBC 8.9 7.1  HGB 10.9* 9.7*  HCT 31.8* 27.5*  MCV 105.0* 103.0*  PLT 121* 112*    Basic Metabolic Panel  Recent Labs  05/23/14 1600  05/24/14 1600 21-May-2014 0400  NA 132*  < > 134* 140  K 4.4  < > 3.0* 3.0*  CL 116*  < > 103 111  CO2 13*  < > 21 25  GLUCOSE 133*  < > 106* 99  BUN 10  < > 16 10  CREATININE 0.83  < > 0.77 0.69  CALCIUM 6.1*  < > 6.8* 6.7*  MG 1.7  --   --  1.6  PHOS 2.3  --   --  1.5*  < > = values in this interval not displayed. Liver Function Tests  Recent Labs  05/23/14 0400 05/24/14 0534  AST 257* 235*  ALT 132* 120*  ALKPHOS 74 67  BILITOT 1.4* 1.1  PROT 4.8* 4.0*  ALBUMIN 2.2* 1.6*   No results for input(s): LIPASE, AMYLASE in the last 72 hours. Cardiac Enzymes  Recent Labs  05/23/14 0400  TROPONINI 3.76*   BNP Invalid input(s): POCBNP D-Dimer No results for input(s): DDIMER in the last 72 hours. Hemoglobin A1C No results for input(s): HGBA1C in the last 72 hours. Fasting Lipid Panel  Recent Labs  05/23/14 0822  TRIG 107  Thyroid Function Tests No results for input(s): TSH, T4TOTAL, T3FREE, THYROIDAB in the last 72 hours.  Invalid input(s): FREET3  TELE  Normal sinus rhythm  ECG 24-May-2014 10:32:01 [facility] ROUTINE RECORD  Critical Test Result: Long QTc Normal sinus rhythm Low voltage QRS T wave abnormality, consider inferior ischemia T wave abnormality, consider anterolateral ischemia Prolonged QT Abnormal ECG Since last tracing Rate slower and AIVR has resolved. Confirmed by Katrinka Blazing MD, Sherilyn Cooter [phone]) on 05/24/2014 3:31:31 PM  2-D echo: - Left ventricle: The cavity size was normal. Wall thickness was normal. Systolic function was severely reduced. The estimated ejection fraction was in the range of 25% to 30%. Features are consistent with a pseudonormal left ventricular filling pattern, with concomitant abnormal relaxation and increased filling pressure (grade 2 diastolic dysfunction). - Regional wall motion abnormality: Akinesis of the basal-mid anteroseptal  myocardium; severe hypokinesis of the mid anterior myocardium; moderate hypokinesis of the mid inferoseptal and apical myocardium. - Aortic valve: Mildly calcified annulus. Trileaflet; mildly thickened leaflets. - Tricuspid valve: There was mild regurgitation. - Pulmonary arteries: PA peak pressure: 35 mm Hg (S). Mildly elevated pulmonary pressures. - Inferior vena cava: The vessel was dilated. The respirophasic diameter changes were blunted (< 50%), consistent with elevated central venous pressure. Radiology/Studies  Ct Head Wo Contrast  06-03-2014   CLINICAL DATA:  Acute cardiac arrest earlier in the day  EXAM: CT HEAD WITHOUT CONTRAST  TECHNIQUE: Contiguous axial images were obtained from the base of the skull through the vertex without intravenous contrast.  COMPARISON:  None.  FINDINGS: The ventricles are normal in size and configuration. There is, however, mild frontal atrophy bilaterally. There is no intracranial mass, hemorrhage, extra-axial fluid collection, or midline shift. There remains gray -white differentiation without appreciable edema by CT. No focal gray-white compartment lesions are identified. No acute infarct is apparent. The bony calvarium appears intact. The mastoid air cells are clear. There is diffuse opacification of the visualize left maxillary antrum. There is rightward deviation of the nasal septum. There is opacification of several ethmoid air cells on the left. Patient is intubated.  IMPRESSION: Mild frontal atrophy bilaterally. No intracranial edema is appreciable. There remains gray-white compartment differentiation. No intracranial mass, hemorrhage, or extra-axial fluid. No acute infarct apparent. Paranasal sinus disease. Rightward deviation nasal septum.   Electronically Signed   By: Bretta Bang M.D.   On: 06/03/14 08:41   Dg Chest Portable 1 View  06/07/2014   CLINICAL DATA:  Pneumonia; assess support apparatus  EXAM: PORTABLE CHEST - 1 VIEW   COMPARISON:  Portable chest x-ray of May 24, 2013  FINDINGS: The lungs are adequately inflated. The hemidiaphragms remain obscured. Increased density in the mid and lower lung zones persists and is slightly more conspicuous. The cardiac silhouette is not enlarged. The pulmonary vascularity is engorged centrally with mild cephalization.  The endotracheal tube tip lies 5 cm above the crotch of the carina. The esophagogastric tube projects below the inferior margin of the image. The central line is unchanged in position with its tip overlying the proximal SVC.  IMPRESSION: Slight interval increase in density in the mid and lower hemithoraces consistent with increasing pleural effusions as well as bibasilar atelectasis. There is CHF with pulmonary interstitial edema. Superimposed pneumonia cannot be excluded.   Electronically Signed   By: David  [place]   On: 05/16/2014 07:32   Dg Chest Port 1 View  05/24/2014   CLINICAL DATA:  Respiratory failure.  EXAM: PORTABLE CHEST - 1 VIEW  COMPARISON:  05/23/2014.  FINDINGS: Endotracheal tube, NG tube, central line in stable position. The mediastinal structures are unremarkable. Heart size stable. Progressive bilateral pulmonary alveolar infiltrates and bilateral pleural effusions noted. These findings are consistent with progressive congestive heart failure. Bilateral pneumonia cannot be excluded. No pneumothorax.  IMPRESSION: 1. Stable line and tube positions. 2. Progressive congestive heart failure with progressive bilateral pulmonary edema and pleural effusions. Superimposed pneumonia cannot be excluded.   Electronically Signed   By: Maisie Fus  Register   On: 05/24/2014 07:19   Dg Chest Port 1 View  05/23/2014   CLINICAL DATA:  Respiratory failure.  EXAM: PORTABLE CHEST - 1 VIEW  COMPARISON:  05-24-2014.  FINDINGS: Endotracheal tube, NG tube, left central line in stable position. Mediastinum and hilar structures are stable. Heart size is stable. However there is interim  development of pulmonary venous congestion and bilateral interstitial prominence with small pleural effusions. These findings suggest congestive heart failure. Superimposed pneumonia cannot be excluded . No pneumothorax.  IMPRESSION: 1. Lines and tubes in stable position. 2. Interim development of pulmonary venous congestion, bilateral interstitial infiltrates, and small pleural effusions. These findings suggest congestive heart failure. Superimposed pneumonia cannot be excluded.   Electronically Signed   By: Maisie Fus  Register   On: 05/23/2014 07:14   Dg Chest Portable 1 View  May 24, 2014   CLINICAL DATA:  Hypoxia.  Status post cardiac arrest  EXAM: PORTABLE CHEST - 1 VIEW  COMPARISON:  Study obtained earlier in the day  FINDINGS: Endotracheal tube tip is 4.1 cm above the carina. Nasogastric tube tip and side port are below the diaphragm. Central catheter tip is in the superior vena cava. No pneumothorax. Lungs are clear. Heart size and pulmonary vascularity are normal. No adenopathy.  IMPRESSION: Tube and catheter positions as described without pneumothorax. No edema or consolidation.   Electronically Signed   By: Bretta Bang M.D.   On: 05-24-14 08:09   Dg Chest Port 1 View  05-24-2014   CLINICAL DATA:  Code STEMI.  Intubated.  NG tube placed.  EXAM: PORTABLE CHEST - 1 VIEW  COMPARISON:  None.  FINDINGS: Endotracheal tube tip measures 4.5 cm above the carinal. Enteric tube tip is off the field of view but below the left hemidiaphragm. Normal heart size and pulmonary vascularity. Mediastinal contours appear intact. Lungs appear clear and expanded.  IMPRESSION: Appliances appear in satisfactory location. No evidence of active pulmonary disease.   Electronically Signed   By: Burman Nieves M.D.   On: 05/24/14 06:52   Ct Portable Head W/o Cm  05/24/2014   CLINICAL DATA:  Cardiac arrest, 60 year old female, anoxic brain injury.  EXAM: CT HEAD WITHOUT CONTRAST  TECHNIQUE: Contiguous axial images were  obtained from the base of the skull through the vertex without contrast.  COMPARISON:  May 24, 2014  FINDINGS: No gross mass effect, sulcal effacement, midline shift, herniation, or extra-axial fluid collection. Gray-white matter differentiation is slightly less distinct than the prior exam which could be related to portable CT technique. Difficult to exclude early cerebral edema. Cisterns remain patent. No acute intracranial hemorrhage. Orbits remains symmetric. Mastoids are clear. Chronic left maxillary sinus opacification and scattered mild sinus disease.  IMPRESSION: No gross acute intracranial hemorrhage, mass effect, hydrocephalus, or herniation.  Slightly less distinct gray-white matter differentiation could be related to limitations of portable technique. Difficult to completely exclude minimal early cerebral edema.   Electronically Signed   By: Ruel Favors M.D.   On: 05/24/2014 13:18    ASSESSMENT AND  PLAN 1. Status post VF arrest.  Echocardiogram shows severe left ventricular systolic dysfunction with ejection fraction 20-25% and multiple wall motion abnormalities. 2. Wide-complex tachycardia has resolved. 3. Hypotension 4. Hypokalemia  Plan: Continue supportive care.  A family conference has been scheduled for later today to discuss plans of care.  Signed, Cassell Clement MD

## 2014-05-25 NOTE — Progress Notes (Signed)
Normothermia pads to be left on pt per CCM. Will reevaluate after family meeting in the morning.

## 2014-05-25 NOTE — Progress Notes (Signed)
Time out performed for insertion of Arterial line.  Unable to insert line in right radial x 2 sticks.

## 2014-05-25 NOTE — Progress Notes (Addendum)
[name] Solis is a 59 y.o. female admitted on 06/09/2014 with VF cardiac arrest with 10 minutes before ROSC.  She was intubated.  She was seen by cardiology.  She was started on hypothermia protocol.  She was started on pressors for shock.  She was started on Abx for aspiration pneumonia.  During rewarming process she was noted to have seizure activity on EEG monitoring.  Neurology was consulted.  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  She was started on AED's and diprivan.  She had CT head which showed evidence for early anoxic brain injury.  She was made DNR, and morphine gtt started.  Subjective: Remains on full vent support, pressors.  Objective: Blood pressure 88/63, pulse 76, temperature 98.6 F (37 C), temperature source Core (Comment), resp. rate 14, height 5\' 6"  (1.676 m), weight 154 lb 2.7 oz (69.93 kg), SpO2 100 %. General: no distress HEENT: ETT in place Cardiac: regular Chest: scattered rhonchi Abd: soft, non tender Ext: 1+ edema Neuro: unresponsive  CBC Recent Labs     05/23/14  0400  05/24/14  0534  05/23/2014  0400  WBC  10.3  8.9  7.1  HGB  11.8*  10.9*  9.7*  HCT  35.4*  31.8*  27.5*  PLT  139*  121*  112*    BMET Recent Labs     05/24/14  0534  05/24/14  1600  05/18/2014  0400  NA  131*  134*  140  K  3.5  3.0*  3.0*  CL  103  103  111  CO2  21  21  25   BUN  9  16  10   CREATININE  0.93  0.77  0.69  GLUCOSE  116*  106*  99    Electrolytes Recent Labs     05/23/14  0400  05/23/14  1600  05/24/14  0534  05/24/14  1600  06/06/2014  0400  CALCIUM  6.7*  6.1*  6.6*  6.8*  6.7*  MG  1.1*  1.7   --    --   1.6  PHOS   --   2.3   --    --   1.5*    ABG Recent Labs     05/23/14  1645  05/24/14  0348  05/24/14  1241  PHART  7.371  7.509*  7.482*  PCO2ART  20.5*  22.8*  25.6*  PO2ART  134.0*  112.0*  67.0*    Liver Enzymes Recent Labs     05/23/14  0400  05/24/14  0534  AST  257*  235*  ALT  132*  120*  ALKPHOS  74  67  BILITOT   1.4*  1.1  ALBUMIN  2.2*  1.6*    Cardiac Enzymes Recent Labs     05/23/14  0400  TROPONINI  3.76*    Glucose Recent Labs     05/24/14  1134  05/24/14  1610  05/24/14  2011  05/24/14  2341  05/19/2014  0319  05/22/2014  0410  GLUCAP  112*  96  94  101*  104*  102*    Imaging Dg Chest Portable 1 View  05/15/2014   CLINICAL DATA:  Pneumonia; assess support apparatus  EXAM: PORTABLE CHEST - 1 VIEW  COMPARISON:  Portable chest x-ray of May 24, 2013  FINDINGS: The lungs are adequately inflated. The hemidiaphragms remain obscured. Increased density in the mid and lower lung zones persists and is slightly more conspicuous. The cardiac  silhouette is not enlarged. The pulmonary vascularity is engorged centrally with mild cephalization.  The endotracheal tube tip lies 5 cm above the crotch of the carina. The esophagogastric tube projects below the inferior margin of the image. The central line is unchanged in position with its tip overlying the proximal SVC.  IMPRESSION: Slight interval increase in density in the mid and lower hemithoraces consistent with increasing pleural effusions as well as bibasilar atelectasis. There is CHF with pulmonary interstitial edema. Superimposed pneumonia cannot be excluded.   Electronically Signed   By: David  [place]   On: 05/21/2014 07:32   Dg Chest Port 1 View  05/24/2014   CLINICAL DATA:  Respiratory failure.  EXAM: PORTABLE CHEST - 1 VIEW  COMPARISON:  05/23/2014.  FINDINGS: Endotracheal tube, NG tube, central line in stable position. The mediastinal structures are unremarkable. Heart size stable. Progressive bilateral pulmonary alveolar infiltrates and bilateral pleural effusions noted. These findings are consistent with progressive congestive heart failure. Bilateral pneumonia cannot be excluded. No pneumothorax.  IMPRESSION: 1. Stable line and tube positions. 2. Progressive congestive heart failure with progressive bilateral pulmonary edema and pleural  effusions. Superimposed pneumonia cannot be excluded.   Electronically Signed   By: Maisie Fus  Register   On: 05/24/2014 07:19   Ct Portable Head W/o Cm  05/24/2014   CLINICAL DATA:  Cardiac arrest, 59 year old female, anoxic brain injury.  EXAM: CT HEAD WITHOUT CONTRAST  TECHNIQUE: Contiguous axial images were obtained from the base of the skull through the vertex without contrast.  COMPARISON:  May 25, 2014  FINDINGS: No gross mass effect, sulcal effacement, midline shift, herniation, or extra-axial fluid collection. Gray-white matter differentiation is slightly less distinct than the prior exam which could be related to portable CT technique. Difficult to exclude early cerebral edema. Cisterns remain patent. No acute intracranial hemorrhage. Orbits remains symmetric. Mastoids are clear. Chronic left maxillary sinus opacification and scattered mild sinus disease.  IMPRESSION: No gross acute intracranial hemorrhage, mass effect, hydrocephalus, or herniation.  Slightly less distinct gray-white matter differentiation could be related to limitations of portable technique. Difficult to completely exclude minimal early cerebral edema.   Electronically Signed   By: Ruel Favors M.D.   On: 05/24/2014 13:18    Impression: VF cardiac arrest Wide complex tachycardia Cardiogenic shock Acute systolic heart failure Septic shock Acute respiratory failure Aspiration PNA Acute kidney injury Gap/non gap metabolic acidosis Hypokalemia Hyperkalemia Hypomagnesemia Hyponatremia Shock liver Thrombocytopenia Status epilepticus Anoxic encephalopathy  Plan: DNR Continue current tx, with no escalation Likely transition to comfort care later today after further discussion with pt's family  Coralyn Helling, MD [facility] Pulmonary/Critical Care 05/27/2014, 8:25 AM Pager:  [phone] After 3pm call: [phone]

## 2014-05-25 NOTE — Progress Notes (Addendum)
Pt not a [facility] Donor Case.  [facility] representatives on unit.  Orders received per [place] donor and then verified and ordered from Dr. Craige CottaSood, CCM.  Labs drawn STAT as ordered. Titrating levophed as needed for renal perfusion. Comfort care measures/meds per order.  Emotional support given to pt & to family. Will continue to monitor pt closely and update as needed.

## 2014-05-25 NOTE — Progress Notes (Signed)
Met with pt's family with assistance of Dr. Nicole Kindred.  Explained her hospital course, and detailed how she had refractory seizures in the setting of anoxic encephalopathy.  Family is agreement that she would not want to be maintained on life support w/o any hope of meaningful recovery.  She is DNR.  Once family is ready will then transition to comfort care with removal of vent support.  Explained process of vent removal, and that time course for dying process can be difficult to predict.  Also explained that she might be transferred to palliative care room, depending on rapidity of dying process.  Chesley Mires, MD [facility] Pulmonary/Critical Care 06/07/2014, 10:32 AM Pager:  [phone] After 3pm call: [phone]

## 2014-05-26 MED ORDER — 0.9 % SODIUM CHLORIDE (POUR BTL) OPTIME
TOPICAL | Status: DC | PRN
  Administered 2014-05-26: 4000 mL
  Administered 2014-05-26: 1000 mL

## 2014-05-27 LAB — CULTURE, RESPIRATORY

## 2014-05-27 LAB — CULTURE, RESPIRATORY W GRAM STAIN

## 2014-05-27 LAB — URINE CULTURE
Colony Count: NO GROWTH
Culture: NO GROWTH

## 2014-05-29 LAB — CULTURE, BLOOD (ROUTINE X 2)
CULTURE: NO GROWTH
Culture: NO GROWTH

## 2014-05-30 ENCOUNTER — Encounter (HOSPITAL_COMMUNITY): Payer: Self-pay

## 2014-05-30 NOTE — Discharge Summary (Signed)
[name] Solis is a 59 y.o. female admitted on 2014/12/23 with VF cardiac arrest with 10 minutes before ROSC.  She was intubated.  She was seen by cardiology.  She was started on hypothermia protocol.  She was started on pressors for shock.  She was started on Abx for aspiration pneumonia.  During rewarming process she was noted to have seizure activity on EEG monitoring.  Neurology was consulted.  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  She was started on AED's and diprivan.  She had CT head which showed evidence for early anoxic brain injury.  She was made DNR, and morphine gtt started.  She was subsequently extubated, and expired on 05/13/2014.  Impression: VF cardiac arrest Wide complex tachycardia Cardiogenic shock Acute systolic heart failure Septic shock Acute respiratory failure Aspiration PNA Acute kidney injury Gap/non gap metabolic acidosis Hypokalemia Hyperkalemia Hypomagnesemia Hyponatremia Shock liver Thrombocytopenia Status epilepticus Anoxic encephalopathy  Coralyn HellingVineet [name] Zeiter, MD [facility] Pulmonary/Critical Care 05/30/2014, 2:27 PM

## 2014-05-30 NOTE — Progress Notes (Signed)
[name] Solis is a 59 y.o. female admitted on 05/25/2014 with VF cardiac arrest with 10 minutes before ROSC.  She was intubated.  She was seen by cardiology.  She was started on hypothermia protocol.  She was started on pressors for shock.  She was started on Abx for aspiration pneumonia.  During rewarming process she was noted to have seizure activity on EEG monitoring.  Neurology was consulted.  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  She was started on AED's and diprivan.  She had CT head which showed evidence for early anoxic brain injury.  She was made DNR, and morphine gtt started.  She was subsequently extubated, and expired on 2014-05-19.  Impression: VF cardiac arrest Wide complex tachycardia Cardiogenic shock Acute systolic heart failure Septic shock Acute respiratory failure Aspiration PNA Acute kidney injury Gap/non gap metabolic acidosis Hypokalemia Hyperkalemia Hypomagnesemia Hyponatremia Shock liver Thrombocytopenia Status epilepticus Anoxic encephalopathy  Coralyn HellingVineet Thaison Kolodziejski, MD [facility] Pulmonary/Critical Care 05/30/2014, 2:25 PM

## 2014-06-01 LAB — CULTURE, BLOOD (ROUTINE X 2)
CULTURE: NO GROWTH
CULTURE: NO GROWTH

## 2014-06-12 NOTE — Progress Notes (Signed)
Transported Pt to OR with no complications. Bagged Pt until time to extubated for procedure. Extubated Pt to room air at 03:38.

## 2014-06-12 NOTE — Progress Notes (Signed)
Pt is an organ donor. To OR with family. [facility] offered. Family left prior to extubation. Pt extubated at 03:38. Pt heartbeat was absent at 0349. Listened for 5 min with [place] Perkins RN. Time of death [id number].

## 2014-06-12 NOTE — Progress Notes (Signed)
eLink Physician-Brief Progress Note Patient Name: [name] Solis DOB: 1955-08-26 MRN: [id number]   Date of Service  10-22-2014  HPI/Events of Note  Organ harvest, needs extubation order  eICU Interventions  written     Intervention Category Minor Interventions: Routine modifications to care plan (e.g. PRN medications for pain, fever)  Josmar Messimer 10-22-2014, 4:09 AM

## 2014-06-12 NOTE — Progress Notes (Signed)
WIS versed 120cc and morphine 195cc. Witnessed by Deniece ReeMary Neal, RN

## 2014-06-12 DEATH — deceased

## 2015-09-07 IMAGING — CR DG CHEST 1V PORT
1 series · 1 of 1 positions shown · non-contrast
Comparison: 05/21/2014.

CLINICAL DATA: Respiratory failure.

EXAM:
PORTABLE CHEST - 1 VIEW

[AP]
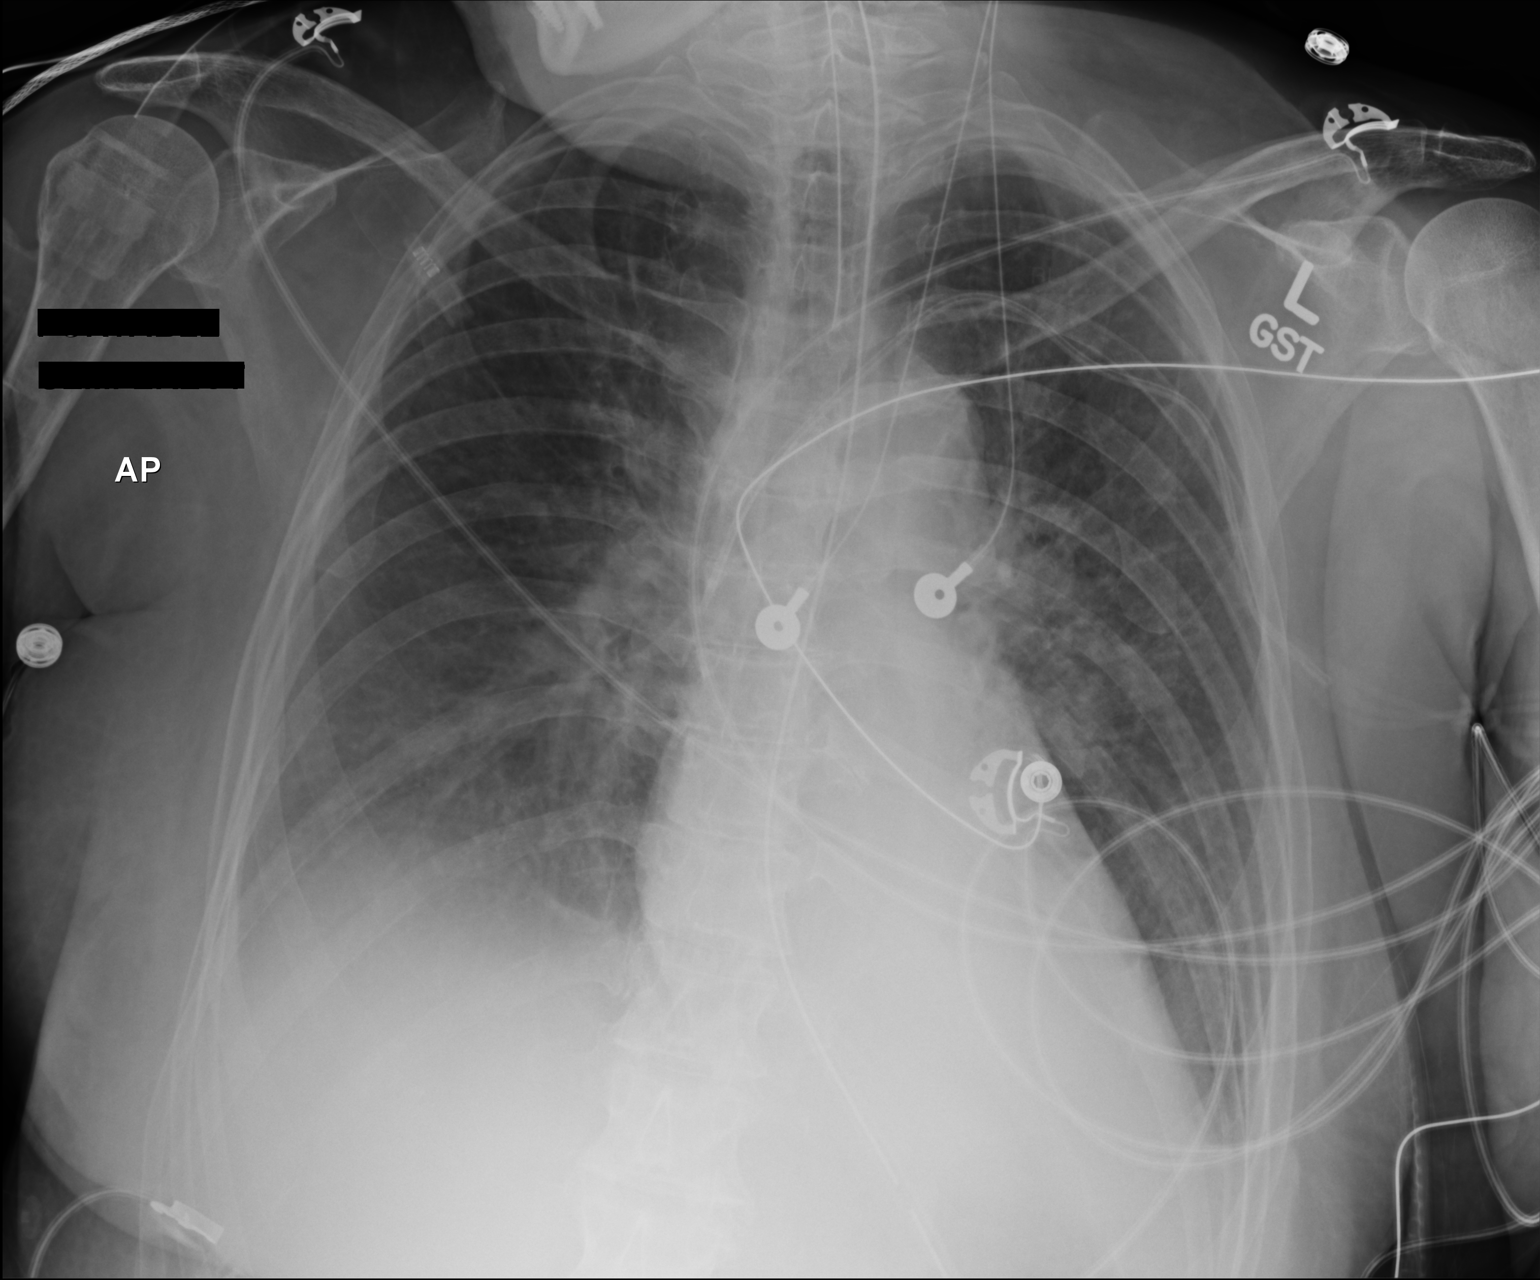

[1 of 1 positions shown; findings below may reference images not displayed]

FINDINGS: Endotracheal tube, NG tube, left central line in stable position.
Mediastinum and hilar structures are stable. Heart size is stable.
However there is interim development of pulmonary venous congestion
and bilateral interstitial prominence with small pleural effusions.
These findings suggest congestive heart failure. Superimposed
pneumonia cannot be excluded . No pneumothorax.
IMPRESSION: 1. Lines and tubes in stable position.
2. Interim development of pulmonary venous congestion, bilateral
interstitial infiltrates, and small pleural effusions. These
findings suggest congestive heart failure. Superimposed pneumonia
cannot be excluded.

## 2015-09-08 IMAGING — CT CT HEAD W/O CM
1 of 2 series · 13 of 30 positions shown, 17 images · non-contrast
Comparison: 05/21/2014

CLINICAL DATA: Cardiac arrest, 58-year-old female, anoxic brain
injury.

EXAM:
CT HEAD WITHOUT CONTRAST
TECHNIQUE: Contiguous axial images were obtained from the base of the skull
through the vertex without contrast.

[Series 1: — · axial · 0.49mm/px · z∈[-443,-253]mm · 13 of 46 slices shown, 17 images]
[im 4/46  brain]
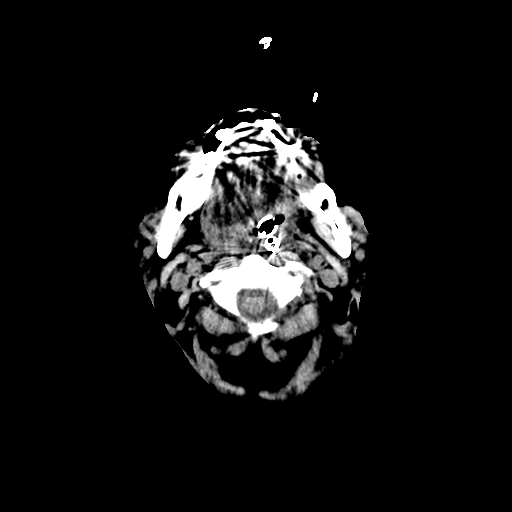
[im 4/46  bone]
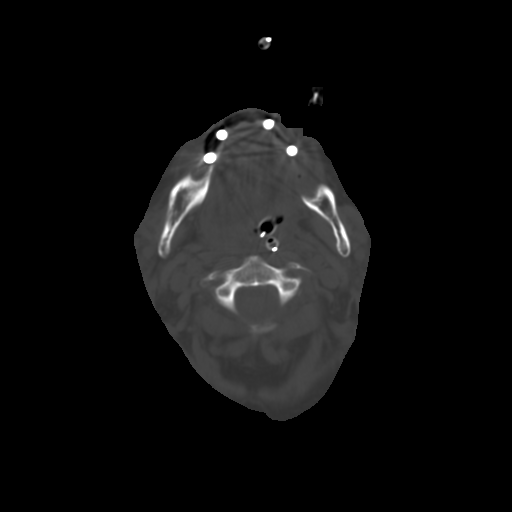
[im 7/46  brain]
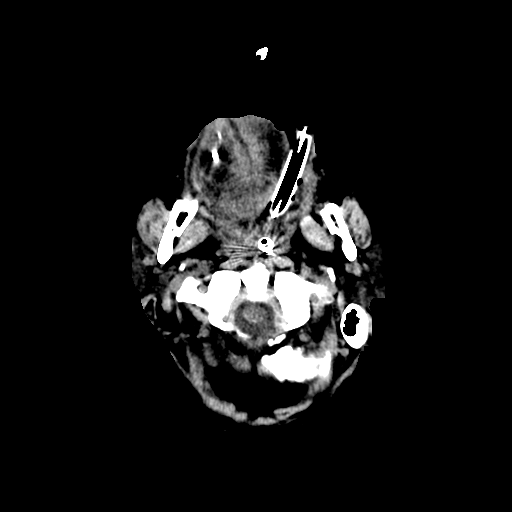
[im 10/46  brain]
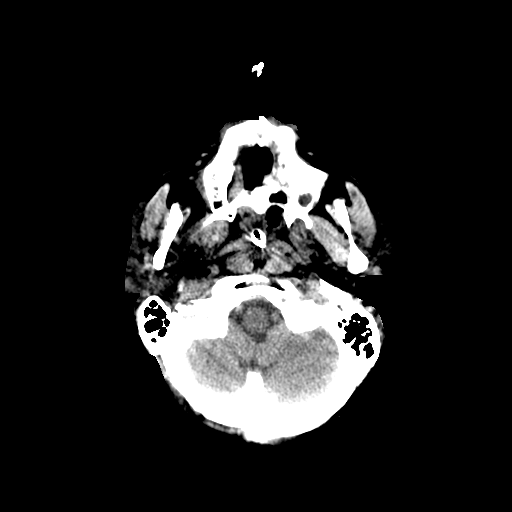
[im 13/46  brain]
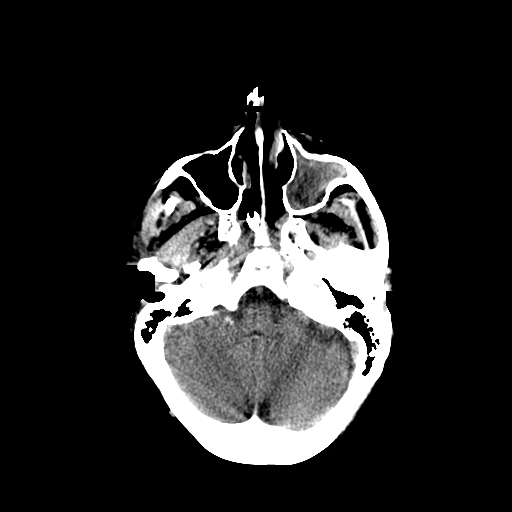
[im 17/46  brain]
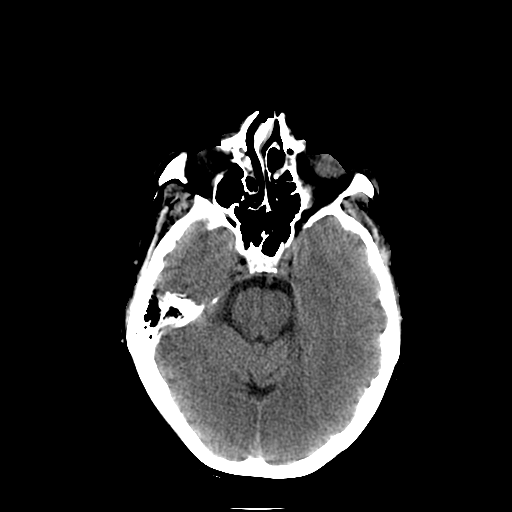
[im 17/46  bone]
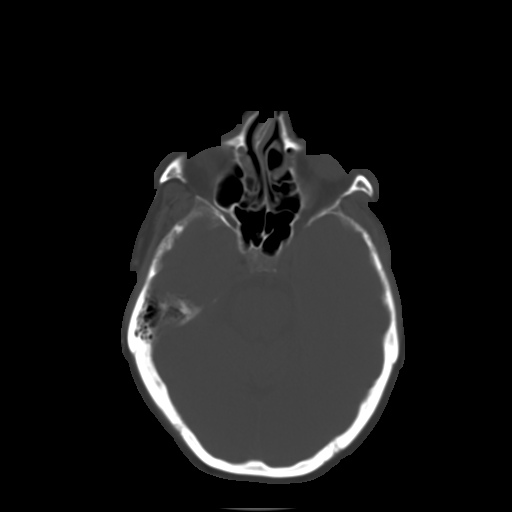
[im 20/46  brain]
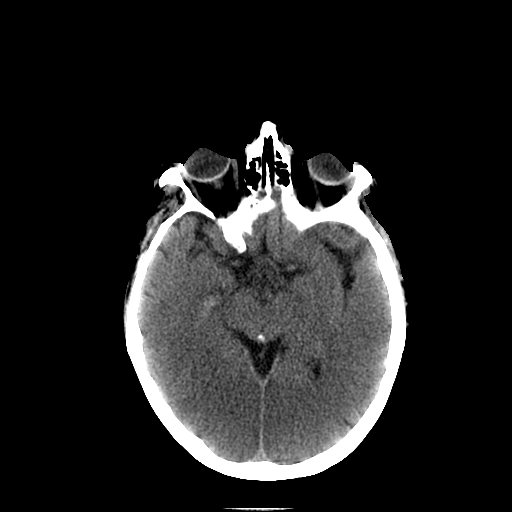
[im 23/46  brain]
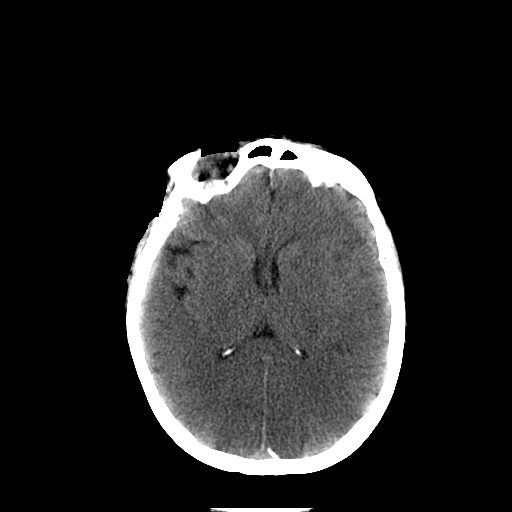
[im 26/46  brain]
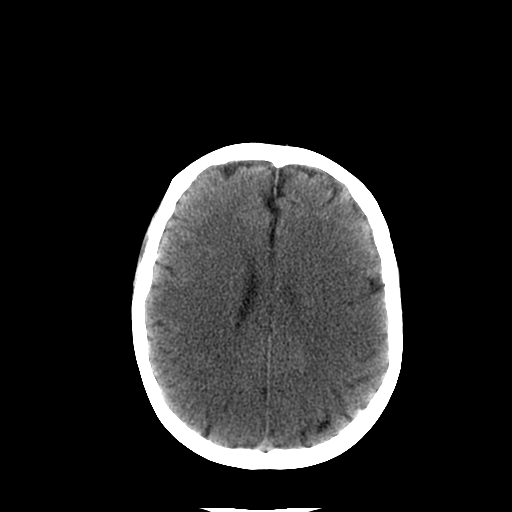
[im 29/46  brain]
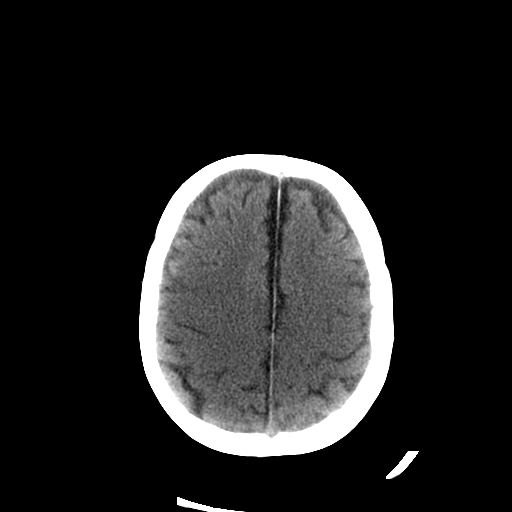
[im 29/46  bone]
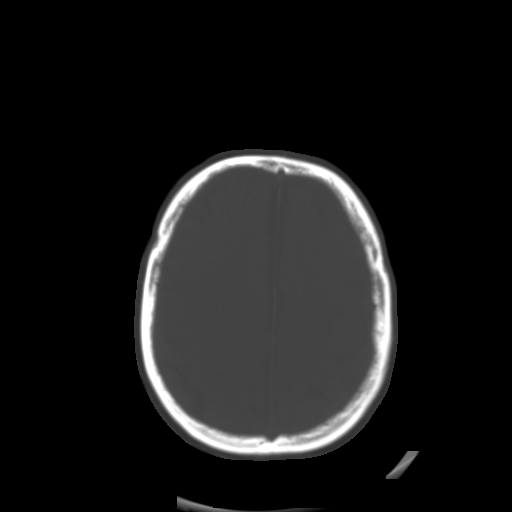
[im 33/46  brain]
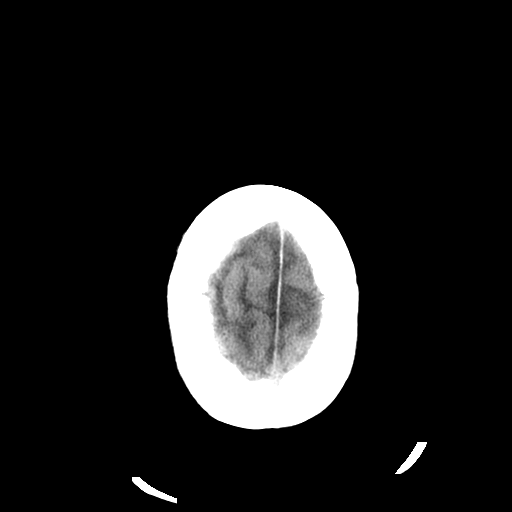
[im 36/46  brain]
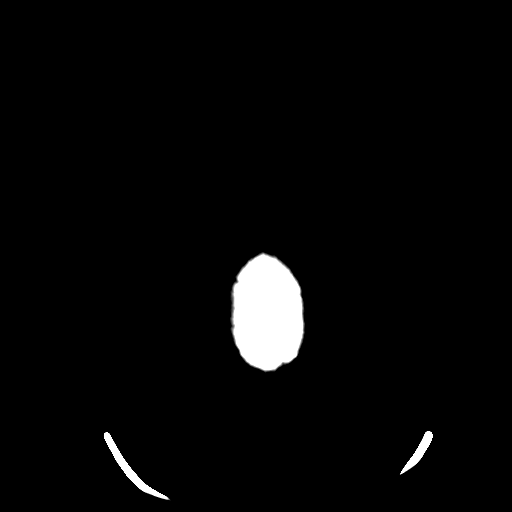
[im 39/46  brain]
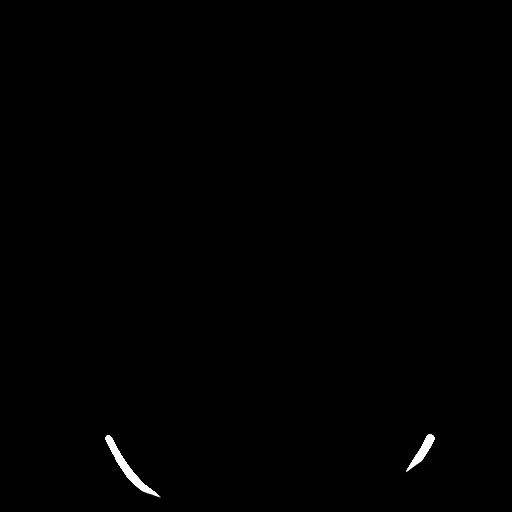
[im 42/46  brain]
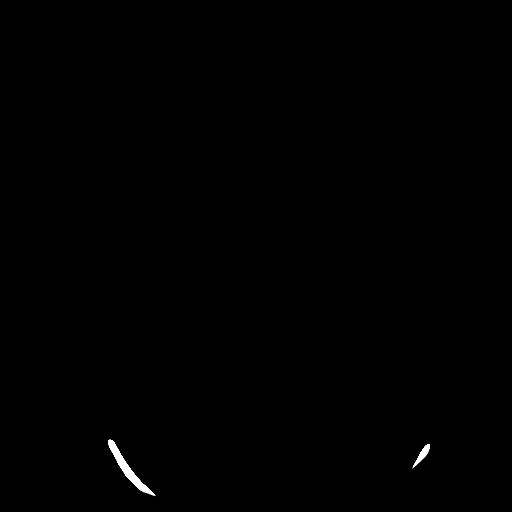
[im 42/46  bone]
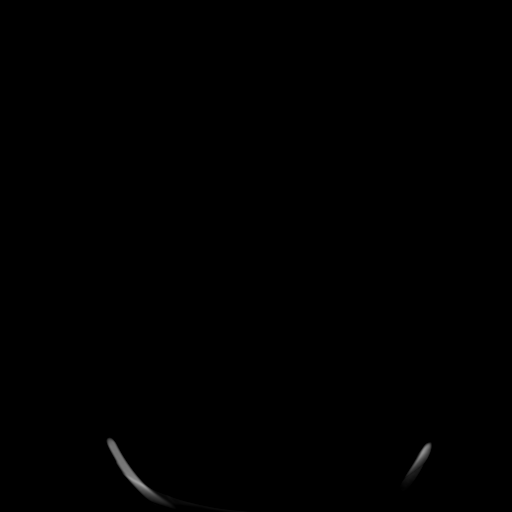

[13 of 30 positions shown; findings below may reference images not displayed]

FINDINGS: No gross mass effect, sulcal effacement, midline shift, herniation,
or extra-axial fluid collection. Gray-white matter differentiation
is slightly less distinct than the prior exam which could be related
to portable CT technique. Difficult to exclude early cerebral edema.
Cisterns remain patent. No acute intracranial hemorrhage. Orbits
remains symmetric. Mastoids are clear. Chronic left maxillary sinus
opacification and scattered mild sinus disease.
IMPRESSION: No gross acute intracranial hemorrhage, mass effect, hydrocephalus,
or herniation.

Slightly less distinct gray-white matter differentiation could be
related to limitations of portable technique. Difficult to
completely exclude minimal early cerebral edema.

## 2015-09-09 IMAGING — CR DG CHEST 1V PORT
1 series · 1 of 1 positions shown · non-contrast
Comparison: Portable chest x-ray May 24, 2013

CLINICAL DATA: Pneumonia; assess support apparatus

EXAM:
PORTABLE CHEST - 1 VIEW

[AP]
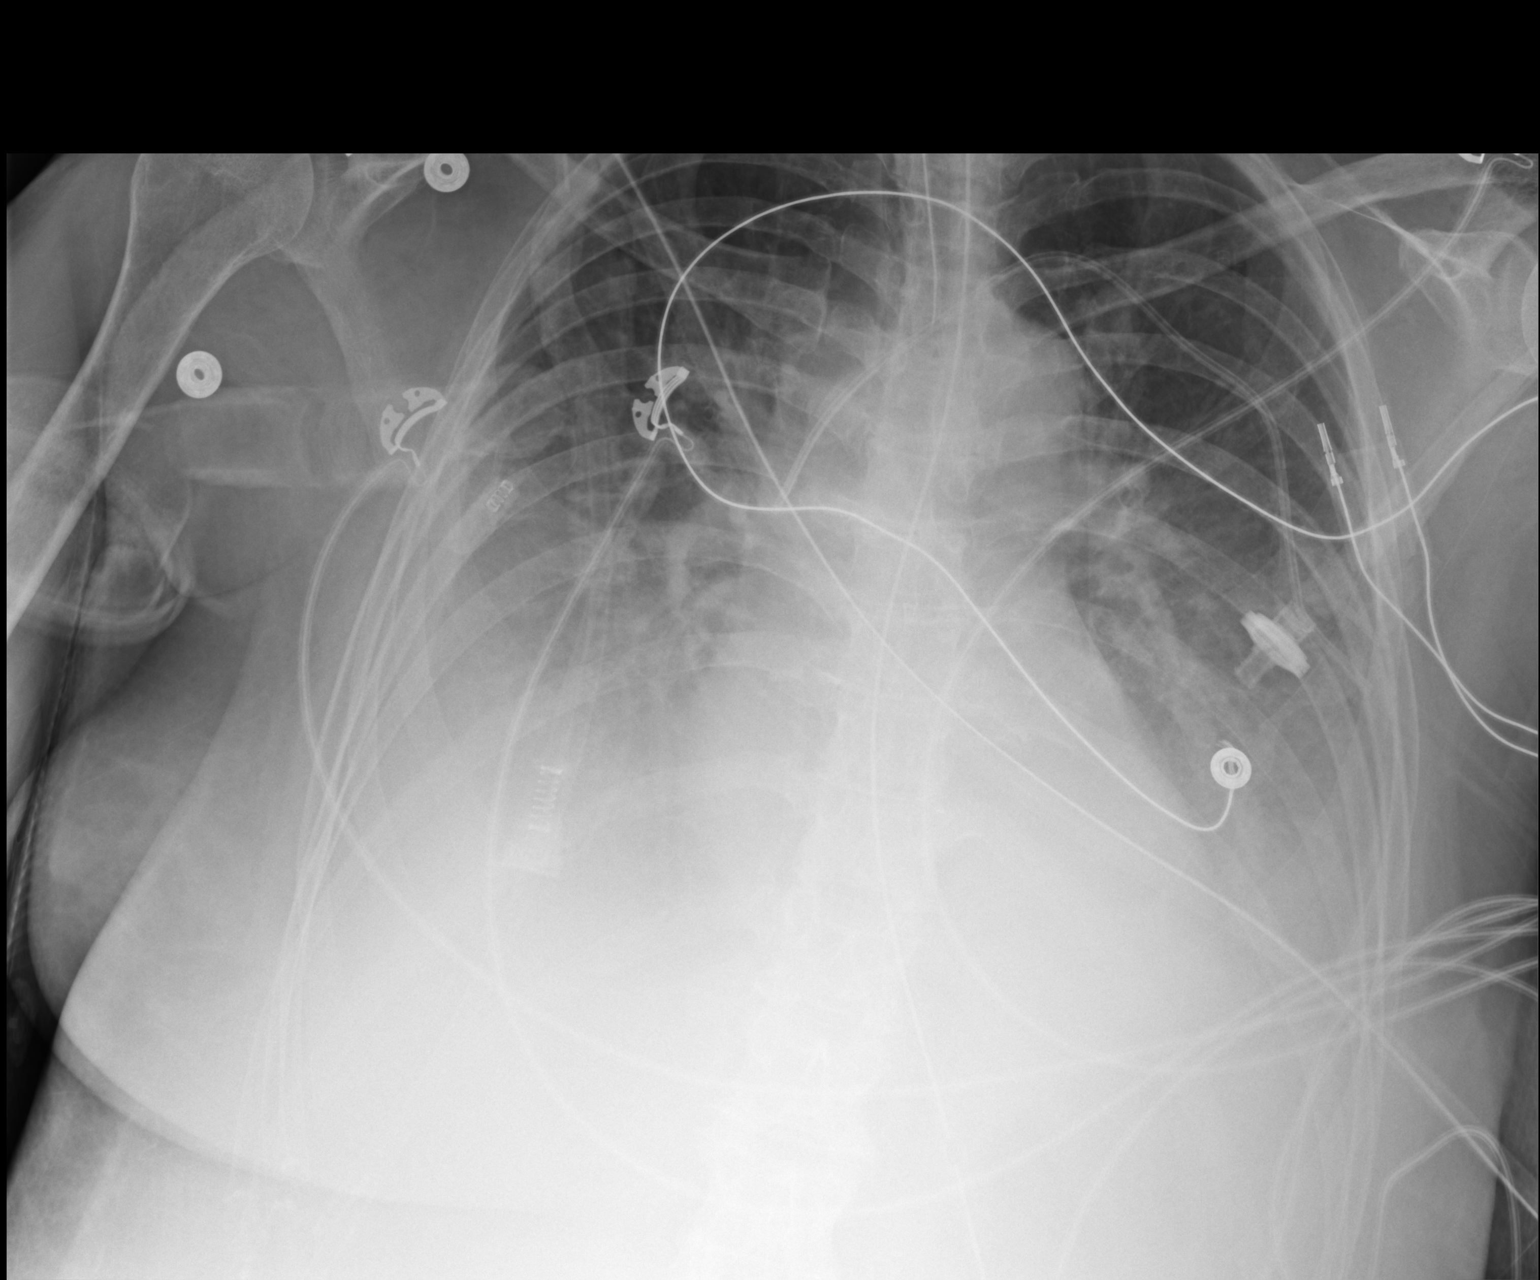

[1 of 1 positions shown; findings below may reference images not displayed]

FINDINGS: The lungs are adequately inflated. The hemidiaphragms remain
obscured. Increased density in the mid and lower lung zones persists
and is slightly more conspicuous. The cardiac silhouette is not
enlarged. The pulmonary vascularity is engorged centrally with mild
cephalization.

The endotracheal tube tip lies 5 cm above the crotch of the carina.
The esophagogastric tube projects below the inferior margin of the
image. The central line is unchanged in position with its tip
overlying the proximal SVC.
IMPRESSION: Slight interval increase in density in the mid and lower
hemithoraces consistent with increasing pleural effusions as well as
bibasilar atelectasis. There is CHF with pulmonary interstitial
edema. Superimposed pneumonia cannot be excluded.
